# Patient Record
Sex: Female | Born: 1985 | Race: White | Hispanic: No | Marital: Married | Smoking: Never smoker
Health system: Southern US, Community
[De-identification: ages and names within clinical notes are randomized; demographics above are authoritative.]

## PROBLEM LIST (undated history)

## (undated) ENCOUNTER — Inpatient Hospital Stay (HOSPITAL_COMMUNITY): Payer: Commercial Managed Care - HMO

## (undated) DIAGNOSIS — Z3490 Encounter for supervision of normal pregnancy, unspecified, unspecified trimester: Secondary | ICD-10-CM

## (undated) DIAGNOSIS — I899 Noninfective disorder of lymphatic vessels and lymph nodes, unspecified: Secondary | ICD-10-CM

## (undated) DIAGNOSIS — Z3491 Encounter for supervision of normal pregnancy, unspecified, first trimester: Secondary | ICD-10-CM

## (undated) DIAGNOSIS — O1495 Unspecified pre-eclampsia, complicating the puerperium: Secondary | ICD-10-CM

## (undated) DIAGNOSIS — Z309 Encounter for contraceptive management, unspecified: Secondary | ICD-10-CM

## (undated) HISTORY — DX: Encounter for supervision of normal pregnancy, unspecified, first trimester: Z34.91

## (undated) HISTORY — PX: NO PAST SURGERIES: SHX2092

## (undated) HISTORY — DX: Encounter for contraceptive management, unspecified: Z30.9

## (undated) HISTORY — DX: Encounter for supervision of normal pregnancy, unspecified, unspecified trimester: Z34.90

## (undated) HISTORY — DX: Noninfective disorder of lymphatic vessels and lymph nodes, unspecified: I89.9

## (undated) HISTORY — DX: Unspecified pre-eclampsia, complicating the puerperium: O14.95

---

## 2008-08-05 ENCOUNTER — Other Ambulatory Visit: Admission: RE | Admit: 2008-08-05 | Discharge: 2008-08-05 | Payer: Self-pay | Admitting: Obstetrics and Gynecology

## 2009-08-08 ENCOUNTER — Other Ambulatory Visit: Admission: RE | Admit: 2009-08-08 | Discharge: 2009-08-08 | Payer: Self-pay | Admitting: Obstetrics and Gynecology

## 2010-11-08 ENCOUNTER — Ambulatory Visit (HOSPITAL_COMMUNITY)
Admission: RE | Admit: 2010-11-08 | Discharge: 2010-11-08 | Payer: Self-pay | Source: Home / Self Care | Attending: Urology | Admitting: Urology

## 2010-11-13 ENCOUNTER — Other Ambulatory Visit
Admission: RE | Admit: 2010-11-13 | Discharge: 2010-11-13 | Payer: Self-pay | Source: Home / Self Care | Admitting: Obstetrics and Gynecology

## 2012-03-12 ENCOUNTER — Other Ambulatory Visit: Payer: Self-pay | Admitting: Adult Health

## 2012-03-12 DIAGNOSIS — N63 Unspecified lump in unspecified breast: Secondary | ICD-10-CM

## 2012-03-19 ENCOUNTER — Ambulatory Visit (HOSPITAL_COMMUNITY)
Admission: RE | Admit: 2012-03-19 | Discharge: 2012-03-19 | Disposition: A | Discharge status: Home / Self Care | Payer: Managed Care, Other (non HMO) | Source: Ambulatory Visit | Attending: Adult Health | Admitting: Adult Health

## 2012-03-19 DIAGNOSIS — Z803 Family history of malignant neoplasm of breast: Secondary | ICD-10-CM | POA: Insufficient documentation

## 2012-03-19 DIAGNOSIS — N63 Unspecified lump in unspecified breast: Secondary | ICD-10-CM | POA: Insufficient documentation

## 2013-05-04 ENCOUNTER — Encounter: Payer: Self-pay | Admitting: *Deleted

## 2013-05-04 ENCOUNTER — Encounter: Payer: Self-pay | Admitting: Adult Health

## 2013-05-04 ENCOUNTER — Other Ambulatory Visit (HOSPITAL_COMMUNITY)
Admission: RE | Admit: 2013-05-04 | Discharge: 2013-05-04 | Disposition: A | Discharge status: Home / Self Care | Payer: Managed Care, Other (non HMO) | Source: Ambulatory Visit | Attending: Obstetrics and Gynecology | Admitting: Obstetrics and Gynecology

## 2013-05-04 ENCOUNTER — Ambulatory Visit (INDEPENDENT_AMBULATORY_CARE_PROVIDER_SITE_OTHER): Payer: Managed Care, Other (non HMO) | Admitting: Adult Health

## 2013-05-04 VITALS — BP 112/58 | Ht 63.0 in | Wt 108.0 lb

## 2013-05-04 DIAGNOSIS — Z01419 Encounter for gynecological examination (general) (routine) without abnormal findings: Secondary | ICD-10-CM

## 2013-05-04 NOTE — Patient Instructions (Addendum)
Physical in 1 year Call prn problems 

## 2013-05-04 NOTE — Progress Notes (Signed)
Patient ID: KEIRAN SIAS, female   DOB: Nov 14, 1985, 27 y.o.   MRN: [id number] History of Present Illness:  Morgan Hodges is a 27 year old white female in for her pap and physical. Has been off OCs since November and periods are every 30-40 days, and is ok if she gets pregnant.  Current Medications, Allergies, Past Medical History, Past Surgical History, Family History and Social History were reviewed in [employer] record.     Review of Systems: Patient denies any headaches, blurred vision, shortness of breath, chest pain, abdominal pain, problems with bowel movements, urination, or intercourse. No joint pain or mood changes     Physical Exam:BP 112/58  Ht 5\' 3"  (1.6 m)  Wt 108 lb (48.988 kg)  BMI 19.14 kg/m2  LMP 04/21/2013 General:  Well developed, well nourished, no acute distress Skin:  Warm and dry, freckles Neck:  Midline trachea, normal thyroid Lungs; Clear to auscultation bilaterally Breast:  No dominant palpable mass, retraction, or nipple discharge Cardiovascular: Regular rate and rhythm Abdomen:  Soft, non tender, no hepatosplenomegaly Pelvic:  External genitalia is normal in appearance.  The vagina is normal in appearance.  The cervix is nulliparous, pap performed. Uterus is felt to be normal size, shape, and contour.  No  adnexal masses or tenderness noted Extremities:  No swelling or varicosities noted Psych: alert and cooperative, seems happy   Impression: Yearly gyn exam    Plan: Physical in 1 year Continue OTC prenatal vitamin Call prn problems

## 2013-07-20 ENCOUNTER — Ambulatory Visit (INDEPENDENT_AMBULATORY_CARE_PROVIDER_SITE_OTHER): Payer: Managed Care, Other (non HMO) | Admitting: Adult Health

## 2013-07-20 ENCOUNTER — Encounter: Payer: Self-pay | Admitting: Adult Health

## 2013-07-20 VITALS — BP 112/60 | Ht 63.0 in | Wt 110.0 lb

## 2013-07-20 DIAGNOSIS — R300 Dysuria: Secondary | ICD-10-CM

## 2013-07-20 DIAGNOSIS — Z3202 Encounter for pregnancy test, result negative: Secondary | ICD-10-CM

## 2013-07-20 LAB — POCT URINALYSIS DIPSTICK
Blood, UA: NEGATIVE
Ketones, UA: NEGATIVE
Protein, UA: NEGATIVE

## 2013-07-22 LAB — URINE CULTURE

## 2013-07-23 ENCOUNTER — Telehealth: Payer: Self-pay | Admitting: Adult Health

## 2013-07-23 NOTE — Telephone Encounter (Signed)
Left message to call.

## 2013-07-27 ENCOUNTER — Telehealth: Payer: Self-pay | Admitting: Adult Health

## 2013-07-28 ENCOUNTER — Other Ambulatory Visit: Payer: Self-pay | Admitting: Obstetrics & Gynecology

## 2013-07-28 DIAGNOSIS — O3680X0 Pregnancy with inconclusive fetal viability, not applicable or unspecified: Secondary | ICD-10-CM

## 2013-07-30 ENCOUNTER — Other Ambulatory Visit: Payer: Managed Care, Other (non HMO)

## 2013-07-31 ENCOUNTER — Ambulatory Visit (INDEPENDENT_AMBULATORY_CARE_PROVIDER_SITE_OTHER): Payer: Managed Care, Other (non HMO)

## 2013-07-31 DIAGNOSIS — O3680X0 Pregnancy with inconclusive fetal viability, not applicable or unspecified: Secondary | ICD-10-CM

## 2013-07-31 DIAGNOSIS — O26849 Uterine size-date discrepancy, unspecified trimester: Secondary | ICD-10-CM

## 2013-07-31 NOTE — Progress Notes (Signed)
U/S-single IUP with +FCA noted, CRL c/w 6+6wks, EDD 03/20/2014, cx long and closed, FHR=153bpm, C.L. Noted on RT=3.6cm simple, Lt ovary appears wnl, no free fluid noted within pelvis

## 2013-08-10 ENCOUNTER — Encounter: Payer: Self-pay | Admitting: Women's Health

## 2013-08-10 ENCOUNTER — Ambulatory Visit (INDEPENDENT_AMBULATORY_CARE_PROVIDER_SITE_OTHER): Payer: Managed Care, Other (non HMO) | Admitting: Women's Health

## 2013-08-10 VITALS — BP 110/60 | Wt 110.5 lb

## 2013-08-10 DIAGNOSIS — Z3401 Encounter for supervision of normal first pregnancy, first trimester: Secondary | ICD-10-CM

## 2013-08-10 DIAGNOSIS — Z331 Pregnant state, incidental: Secondary | ICD-10-CM

## 2013-08-10 DIAGNOSIS — Z1389 Encounter for screening for other disorder: Secondary | ICD-10-CM

## 2013-08-10 DIAGNOSIS — Z3400 Encounter for supervision of normal first pregnancy, unspecified trimester: Secondary | ICD-10-CM

## 2013-08-10 LAB — POCT URINALYSIS DIPSTICK
Glucose, UA: NEGATIVE
Ketones, UA: NEGATIVE
Leukocytes, UA: NEGATIVE
Protein, UA: NEGATIVE

## 2013-08-10 LAB — URINALYSIS
Hgb urine dipstick: NEGATIVE
Leukocytes, UA: NEGATIVE
Nitrite: NEGATIVE
Protein, ur: NEGATIVE mg/dL
pH: 6.5 (ref 5.0–8.0)

## 2013-08-10 LAB — CBC
Platelets: 303 10*3/uL (ref 150–400)
RBC: 4.1 MIL/uL (ref 3.87–5.11)
WBC: 10.9 10*3/uL — ABNORMAL HIGH (ref 4.0–10.5)

## 2013-08-10 LAB — HEPATITIS B SURFACE ANTIGEN: Hepatitis B Surface Ag: NEGATIVE

## 2013-08-10 NOTE — Telephone Encounter (Signed)
No answer x1

## 2013-08-10 NOTE — Progress Notes (Signed)
New OB packet given. Consents signed.  

## 2013-08-10 NOTE — Patient Instructions (Addendum)
Nausea & Vomiting  Have saltine crackers or pretzels by your bed and eat a few bites before you raise your head out of bed in the morning  Eat small frequent meals throughout the day instead of large meals  Drink plenty of fluids throughout the day to stay hydrated, just don't drink a lot of fluids with your meals.  This can make your stomach fill up faster making you feel sick  Do not brush your teeth right after you eat  Products with real ginger are good for nausea, like ginger ale and ginger hard candy Make sure it says made with real ginger!  Sucking on sour candy like lemon heads is also good for nausea  If your prenatal vitamins make you nauseated, take them at night so you will sleep through the nausea  If you feel like you need medicine for the nausea & vomiting please let us know  If you are unable to keep any fluids or food down please let us know    Pregnancy - First Trimester During sexual intercourse, millions of sperm go into the vagina. Only 1 sperm will penetrate and fertilize the female egg while it is in the Fallopian tube. One week later, the fertilized egg implants into the wall of the uterus. An embryo begins to develop into a baby. At 6 to 8 weeks, the eyes and face are formed and the heartbeat can be seen on ultrasound. At the end of 12 weeks (first trimester), all the baby's organs are formed. Now that you are pregnant, you will want to do everything you can to have a healthy baby. Two of the most important things are to get good prenatal care and follow your caregiver's instructions. Prenatal care is all the medical care you receive before the baby's birth. It is given to prevent, find, and treat problems during the pregnancy and childbirth. PRENATAL EXAMS  During prenatal visits, your weight, blood pressure, and urine are checked. This is done to make sure you are healthy and progressing normally during the pregnancy.  A pregnant woman should gain 25 to 35 pounds  during the pregnancy. However, if you are overweight or underweight, your caregiver will advise you regarding your weight.  Your caregiver will ask and answer questions for you.  Blood work, cervical cultures, other necessary tests, and a Pap test are done during your prenatal exams. These tests are done to check on your health and the probable health of your baby. Tests are strongly recommended and done for HIV with your permission. This is the virus that causes AIDS. These tests are done because medicines can be given to help prevent your baby from being born with this infection should you have been infected without knowing it. Blood work is also used to find out your blood type, previous infections, and follow your blood levels (hemoglobin).  Low hemoglobin (anemia) is common during pregnancy. Iron and vitamins are given to help prevent this. Later in the pregnancy, blood tests for diabetes will be done along with any other tests if any problems develop.  You may need other tests to make sure you and the baby are doing well. CHANGES DURING THE FIRST TRIMESTER  Your body goes through many changes during pregnancy. They vary from person to person. Talk to your caregiver about changes you notice and are concerned about. Changes can include:  Your menstrual period stops.  The egg and sperm carry the genes that determine what you look like. Genes from you   and your partner are forming a baby. The female genes determine whether the baby is a boy or a girl.  Your body increases in girth and you may feel bloated.  Feeling sick to your stomach (nauseous) and throwing up (vomiting). If the vomiting is uncontrollable, call your caregiver.  Your breasts will begin to enlarge and become tender.  Your nipples may stick out more and become darker.  The need to urinate more. Painful urination may mean you have a bladder infection.  Tiring easily.  Loss of appetite.  Cravings for certain kinds of  food.  At first, you may gain or lose a couple of pounds.  You may have changes in your emotions from day to day (excited to be pregnant or concerned something may go wrong with the pregnancy and baby).  You may have more vivid and strange dreams. HOME CARE INSTRUCTIONS   It is very important to avoid all smoking, alcohol and non-prescribed drugs during your pregnancy. These affect the formation and growth of the baby. Avoid chemicals while pregnant to ensure the delivery of a healthy infant.  Start your prenatal visits by the 12th week of pregnancy. They are usually scheduled monthly at first, then more often in the last 2 months before delivery. Keep your caregiver's appointments. Follow your caregiver's instructions regarding medicine use, blood and lab tests, exercise, and diet.  During pregnancy, you are providing food for you and your baby. Eat regular, well-balanced meals. Choose foods such as meat, fish, milk and other low fat dairy products, vegetables, fruits, and whole-grain breads and cereals. Your caregiver will tell you of the ideal weight gain.  You can help morning sickness by keeping soda crackers at the bedside. Eat a couple before arising in the morning. You may want to use the crackers without salt on them.  Eating 4 to 5 small meals rather than 3 large meals a day also may help the nausea and vomiting.  Drinking liquids between meals instead of during meals also seems to help nausea and vomiting.  A physical sexual relationship may be continued throughout pregnancy if there are no other problems. Problems may be early (premature) leaking of amniotic fluid from the membranes, vaginal bleeding, or belly (abdominal) pain.  Exercise regularly if there are no restrictions. Check with your caregiver or physical therapist if you are unsure of the safety of some of your exercises. Greater weight gain will occur in the last 2 trimesters of pregnancy. Exercising will  help:  Control your weight.  Keep you in shape.  Prepare you for labor and delivery.  Help you lose your pregnancy weight after you deliver your baby.  Wear a good support or jogging bra for breast tenderness during pregnancy. This may help if worn during sleep too.  Ask when prenatal classes are available. Begin classes when they are offered.  Do not use hot tubs, steam rooms, or saunas.  Wear your seat belt when driving. This protects you and your baby if you are in an accident.  Avoid raw meat, uncooked cheese, cat litter boxes, and soil used by cats throughout the pregnancy. These carry germs that can cause birth defects in the baby.  The first trimester is a good time to visit your dentist for your dental health. Getting your teeth cleaned is okay. Use a softer toothbrush and brush gently during pregnancy.  Ask for help if you have financial, counseling, or nutritional needs during pregnancy. Your caregiver will be able to offer counseling for   these needs as well as refer you for other special needs.  Do not take any medicines or herbs unless told by your caregiver.  Inform your caregiver if there is any mental or physical domestic violence.  Make a list of emergency phone numbers of family, friends, hospital, and police and fire departments.  Write down your questions. Take them to your prenatal visit.  Do not douche.  Do not cross your legs.  If you have to stand for long periods of time, rotate you feet or take small steps in a circle.  You may have more vaginal secretions that may require a sanitary pad. Do not use tampons or scented sanitary pads. MEDICINES AND DRUG USE IN PREGNANCY  Take prenatal vitamins as directed. The vitamin should contain 1 milligram of folic acid. Keep all vitamins out of reach of children. Only a couple vitamins or tablets containing iron may be fatal to a baby or young child when ingested.  Avoid use of all medicines, including herbs,  over-the-counter medicines, not prescribed or suggested by your caregiver. Only take over-the-counter or prescription medicines for pain, discomfort, or fever as directed by your caregiver. Do not use aspirin, ibuprofen, or naproxen unless directed by your caregiver.  Let your caregiver also know about herbs you may be using.  Alcohol is related to a number of birth defects. This includes fetal alcohol syndrome. All alcohol, in any form, should be avoided completely. Smoking will cause low birth rate and premature babies.  Street or illegal drugs are very harmful to the baby. They are absolutely forbidden. A baby born to an addicted mother will be addicted at birth. The baby will go through the same withdrawal an adult does.  Let your caregiver know about any medicines that you have to take and for what reason you take them. SEEK MEDICAL CARE IF:  You have any concerns or worries during your pregnancy. It is better to call with your questions if you feel they cannot wait, rather than worry about them. SEEK IMMEDIATE MEDICAL CARE IF:   An unexplained oral temperature above 102 F (38.9 C) develops, or as your caregiver suggests.  You have leaking of fluid from the vagina (birth canal). If leaking membranes are suspected, take your temperature and inform your caregiver of this when you call.  There is vaginal spotting or bleeding. Notify your caregiver of the amount and how many pads are used.  You develop a bad smelling vaginal discharge with a change in the color.  You continue to feel sick to your stomach (nauseated) and have no relief from remedies suggested. You vomit blood or coffee ground-like materials.  You lose more than 2 pounds of weight in 1 week.  You gain more than 2 pounds of weight in 1 week and you notice swelling of your face, hands, feet, or legs.  You gain 5 pounds or more in 1 week (even if you do not have swelling of your hands, face, legs, or feet).  You get  exposed to German measles and have never had them.  You are exposed to fifth disease or chickenpox.  You develop belly (abdominal) pain. Round ligament discomfort is a common non-cancerous (benign) cause of abdominal pain in pregnancy. Your caregiver still must evaluate this.  You develop headache, fever, diarrhea, pain with urination, or shortness of breath.  You fall or are in a car accident or have any kind of trauma.  There is mental or physical violence in your home. Document   Released: 10/16/2001 Document Revised: 07/16/2012 Document Reviewed: 04/19/2009 Exit[employer] Patient Information 2014 [employer].  

## 2013-08-10 NOTE — Progress Notes (Addendum)
  Subjective:    Morgan Hodges is a 27 y.o. G1P0 Caucasian female at [redacted]w[redacted]d by 6.6wk u/s, being seen today for her first obstetrical visit.  Her obstetrical history is significant for primigravida.  Pregnancy history fully reviewed.   Patient reports rare nausea. No cramping, vb, uti s/s, abnormal/malodorous d/c or vulvovaginal itching/irritation. . She does report more frequent ha's than before getting pregnant. Are usually at base of skull, does help when she eats.   Filed Vitals:   08/10/13 1054  BP: 110/60  Weight: 110 lb 8 oz (50.122 kg)    HISTORY: OB History  Gravida Para Term Preterm AB SAB TAB Ectopic Multiple Living  1             # Outcome Date GA Lbr Len/2nd Weight Sex Delivery Anes PTL Lv  1 CUR              Past Medical History  Diagnosis Date  . Lymph node disorder     cervical   History reviewed. No pertinent past surgical history. Family History  Problem Relation Age of Onset  . Cancer Mother     breast  . Cancer Maternal Grandmother     breast     Exam   System:     Skin: normal coloration and turgor, no rashes    Neurologic: oriented, normal mood   Extremities: normal strength, tone, and muscle mass   HEENT PERRLA   Mouth/Teeth mucous membranes moist   Cardiovascular: regular rate and rhythm   Respiratory:  appears well, vitals normal, no respiratory distress, acyanotic, normal RR   Abdomen: soft, non-tender   FHR: 181 via informal u/s by tasha Thin prep pap smear not obtained, had neg pap 04/2013   Assessment:    Pregnancy: G1P0 Patient Active Problem List   Diagnosis Date Noted  . Supervision of normal first pregnancy 08/10/2013    Priority: High      [redacted]w[redacted]d G1P0 New OB visit Headaches Rare nausea    Plan:     Initial labs drawn Continue prenatal vitamins Problem list reviewed and updated Reviewed n/v relief measures and warning s/s to report Reviewed recommended weight gain based on pre-gravid BMI Encouraged  well-balanced diet Genetic Screening discussed Integrated Screen: requested Cystic fibrosis screening discussed requested Ultrasound discussed; fetal survey: requested Follow up in 4 weeks for 1st nt/it and visit Reviewed headache prevention/relief measures including shoulder/neck massages d/t possible tension component To try to avoid medicines other than pnv in 1st trimester, but if needs to take something for ha, to try apap  Marge Duncans 08/10/2013 11:09 AM

## 2013-08-11 ENCOUNTER — Encounter: Payer: Self-pay | Admitting: Women's Health

## 2013-08-11 LAB — DRUG SCREEN, URINE, NO CONFIRMATION
Barbiturate Quant, Ur: NEGATIVE
Creatinine,U: 129.8 mg/dL
Opiate Screen, Urine: NEGATIVE
Propoxyphene: NEGATIVE

## 2013-08-11 LAB — GC/CHLAMYDIA PROBE AMP: CT Probe RNA: NEGATIVE

## 2013-08-11 LAB — RUBELLA SCREEN: Rubella: 2.52 Index — ABNORMAL HIGH (ref <0.90)

## 2013-08-11 LAB — ANTIBODY SCREEN: Antibody Screen: NEGATIVE

## 2013-08-13 LAB — CYSTIC FIBROSIS DIAGNOSTIC STUDY

## 2013-08-15 ENCOUNTER — Encounter: Payer: Self-pay | Admitting: Women's Health

## 2013-09-11 ENCOUNTER — Other Ambulatory Visit: Payer: Self-pay | Admitting: Obstetrics & Gynecology

## 2013-09-11 ENCOUNTER — Other Ambulatory Visit: Payer: Self-pay | Admitting: Women's Health

## 2013-09-11 ENCOUNTER — Ambulatory Visit (INDEPENDENT_AMBULATORY_CARE_PROVIDER_SITE_OTHER): Payer: Managed Care, Other (non HMO) | Admitting: Obstetrics & Gynecology

## 2013-09-11 ENCOUNTER — Encounter (INDEPENDENT_AMBULATORY_CARE_PROVIDER_SITE_OTHER): Payer: Self-pay

## 2013-09-11 ENCOUNTER — Ambulatory Visit (INDEPENDENT_AMBULATORY_CARE_PROVIDER_SITE_OTHER): Payer: Managed Care, Other (non HMO)

## 2013-09-11 ENCOUNTER — Encounter: Payer: Self-pay | Admitting: Obstetrics & Gynecology

## 2013-09-11 VITALS — BP 100/60 | Wt 113.0 lb

## 2013-09-11 DIAGNOSIS — Z331 Pregnant state, incidental: Secondary | ICD-10-CM

## 2013-09-11 DIAGNOSIS — Z1389 Encounter for screening for other disorder: Secondary | ICD-10-CM

## 2013-09-11 DIAGNOSIS — Z3403 Encounter for supervision of normal first pregnancy, third trimester: Secondary | ICD-10-CM

## 2013-09-11 DIAGNOSIS — Z3400 Encounter for supervision of normal first pregnancy, unspecified trimester: Secondary | ICD-10-CM

## 2013-09-11 DIAGNOSIS — Z36 Encounter for antenatal screening of mother: Secondary | ICD-10-CM

## 2013-09-11 DIAGNOSIS — Z3401 Encounter for supervision of normal first pregnancy, first trimester: Secondary | ICD-10-CM

## 2013-09-11 LAB — POCT URINALYSIS DIPSTICK
Blood, UA: NEGATIVE
Glucose, UA: NEGATIVE
Protein, UA: NEGATIVE

## 2013-09-11 NOTE — Progress Notes (Signed)
No bleeding no problems nausea improving no emesis

## 2013-09-11 NOTE — Progress Notes (Signed)
U/S(12+6wks)-single IUP with +FCA noted, FHR- 165 bpm, cx long and closed, bilateral adnexa wnl, CRL c/w dates, NB present, NT-1.52mm, posterior Gr 0 placenta

## 2013-10-08 ENCOUNTER — Encounter: Payer: Self-pay | Admitting: Advanced Practice Midwife

## 2013-10-08 ENCOUNTER — Ambulatory Visit (INDEPENDENT_AMBULATORY_CARE_PROVIDER_SITE_OTHER): Payer: Managed Care, Other (non HMO) | Admitting: Advanced Practice Midwife

## 2013-10-08 ENCOUNTER — Other Ambulatory Visit: Payer: Self-pay | Admitting: Advanced Practice Midwife

## 2013-10-08 VITALS — BP 102/48 | Wt 116.2 lb

## 2013-10-08 DIAGNOSIS — Z331 Pregnant state, incidental: Secondary | ICD-10-CM

## 2013-10-08 DIAGNOSIS — Z3402 Encounter for supervision of normal first pregnancy, second trimester: Secondary | ICD-10-CM

## 2013-10-08 DIAGNOSIS — Z1389 Encounter for screening for other disorder: Secondary | ICD-10-CM

## 2013-10-08 DIAGNOSIS — O9989 Other specified diseases and conditions complicating pregnancy, childbirth and the puerperium: Secondary | ICD-10-CM

## 2013-10-08 LAB — POCT URINALYSIS DIPSTICK
Glucose, UA: NEGATIVE
Ketones, UA: NEGATIVE
Leukocytes, UA: NEGATIVE

## 2013-10-08 MED ORDER — BUTALBITAL-APAP-CAFFEINE 50-325-40 MG PO TABS: 1.0000 | ORAL_TABLET | Freq: Four times a day (QID) | ORAL | Status: DC | PRN

## 2013-10-08 NOTE — Progress Notes (Signed)
Having headaches unrelieved by tylenol, rest and neck massages. Rx for Fioricet given. Denies pain, bleeding and leaking of fluid. All questions answered. F/u in 3 weeks for visit and ultrasound.

## 2013-10-11 LAB — MATERNAL SCREEN, INTEGRATED #2
AFP, Serum: 60 ng/mL
Age risk Down Syndrome: 1:860 {titer}
Estriol Mom: 1.12
Estriol, Free: 1.39 ng/mL
Inhibin A Dimeric: 113 pg/mL
Inhibin A MoM: 0.6
MSS Down Syndrome: <1:5000 {titer}
Number of fetuses: 1
PAPP-A: 1057 ng/mL
Rish for ONTD: <1:5000 {titer}

## 2013-10-27 ENCOUNTER — Other Ambulatory Visit: Payer: Managed Care, Other (non HMO)

## 2013-10-27 ENCOUNTER — Encounter: Payer: Managed Care, Other (non HMO) | Admitting: Women's Health

## 2013-11-05 NOTE — L&D Delivery Note (Signed)
I directly supervised birth and laceration repair. Cord clamped and cut and nonvigorous infant taken to warmer for waiting NICU team d/t mod-thick mec. Please refer to their notes.  Plans to breastfeed, micronor for contraception Cheral MarkerKimberly R. Lundyn Coste, CNM, [facility]WHNP-BC 03/21/2014 8:34 AM

## 2013-11-05 NOTE — L&D Delivery Note (Signed)
Delivery Note At 5:26 AM a viable female was delivered via Vaginal, Spontaneous Delivery (Presentation: ; Occiput Anterior).  APGAR: 7, 9; weight 7 lb 6 oz (3345 g).   Placenta status: Intact, Spontaneous.  Cord: 3 vessels with the following complications: marginal insertion  Anesthesia: Local Epidural, local lidocaine 9 cc injected at site of lacerations Episiotomy: none Lacerations: left labial and right sulcus Suture Repair: 3.0 vicryl Est. Blood Loss (mL): 400  Mom to postpartum.  Baby to Couplet care / Skin to Skin.  Morgan Hodges 03/21/2014, 6:19 AM

## 2013-11-09 ENCOUNTER — Encounter (INDEPENDENT_AMBULATORY_CARE_PROVIDER_SITE_OTHER): Payer: Self-pay

## 2013-11-09 ENCOUNTER — Encounter: Payer: Self-pay | Admitting: Women's Health

## 2013-11-09 ENCOUNTER — Other Ambulatory Visit: Payer: Self-pay | Admitting: Advanced Practice Midwife

## 2013-11-09 ENCOUNTER — Ambulatory Visit (INDEPENDENT_AMBULATORY_CARE_PROVIDER_SITE_OTHER): Payer: 59

## 2013-11-09 ENCOUNTER — Ambulatory Visit (INDEPENDENT_AMBULATORY_CARE_PROVIDER_SITE_OTHER): Payer: 59 | Admitting: Women's Health

## 2013-11-09 VITALS — BP 110/52 | Wt 121.5 lb

## 2013-11-09 DIAGNOSIS — Z1389 Encounter for screening for other disorder: Secondary | ICD-10-CM

## 2013-11-09 DIAGNOSIS — Z3400 Encounter for supervision of normal first pregnancy, unspecified trimester: Secondary | ICD-10-CM

## 2013-11-09 DIAGNOSIS — Z3402 Encounter for supervision of normal first pregnancy, second trimester: Secondary | ICD-10-CM

## 2013-11-09 DIAGNOSIS — Z331 Pregnant state, incidental: Secondary | ICD-10-CM

## 2013-11-09 LAB — POCT URINALYSIS DIPSTICK
GLUCOSE UA: NEGATIVE
Ketones, UA: NEGATIVE
LEUKOCYTES UA: NEGATIVE
NITRITE UA: NEGATIVE
Protein, UA: NEGATIVE
RBC UA: NEGATIVE

## 2013-11-09 NOTE — Progress Notes (Signed)
U/S(21+2wks)-active fetus, breech presentation, meas c/w dates, fluid wnl, posterior Gr 0 placenta, cx long and closed (3.7cm), bilateral adnexa WNL, no major abnl noted, female fetus, FHR-161 bpm

## 2013-11-09 NOTE — Patient Instructions (Signed)
[place] Pediatricians:  [facility] [phone]            [facility] [phone]                 [facility] [phone] (usually doesn't accept new patients unless you have family there already, you are always welcome to call and ask)             [facility] ([address]) [phone]   [facility] Pediatricians:   [facility]: [phone]  [facility]: [phone]   Second Trimester of Pregnancy The second trimester is from week 13 through week 28, months 4 through 6. The second trimester is often a time when you feel your best. Your body has also adjusted to being pregnant, and you begin to feel better physically. Usually, morning sickness has lessened or quit completely, you may have more energy, and you may have an increase in appetite. The second trimester is also a time when the fetus is growing rapidly. At the end of the sixth month, the fetus is about 9 inches long and weighs about 1 pounds. You will likely begin to feel the baby move (quickening) between 18 and 20 weeks of the pregnancy. BODY CHANGES Your body goes through many changes during pregnancy. The changes vary from woman to woman.   Your weight will continue to increase. You will notice your lower abdomen bulging out.  You may begin to get stretch marks on your hips, abdomen, and breasts.  You may develop headaches that can be relieved by medicines approved by your caregiver.  You may urinate more often because the fetus is pressing on your bladder.  You may develop or continue to have heartburn as a result of your pregnancy.  You may develop constipation because certain hormones are causing the muscles that push waste through your intestines to slow down.  You may develop hemorrhoids or swollen, bulging veins (varicose veins).  You may have back pain because of the weight gain and pregnancy  hormones relaxing your joints between the bones in your pelvis and as a result of a shift in weight and the muscles that support your balance.  Your breasts will continue to grow and be tender.  Your gums may bleed and may be sensitive to brushing and flossing.  Dark spots or blotches (chloasma, mask of pregnancy) may develop on your face. This will likely fade after the baby is born.  A dark line from your belly button to the pubic area (linea nigra) may appear. This will likely fade after the baby is born. WHAT TO EXPECT AT YOUR PRENATAL VISITS During a routine prenatal visit:  You will be weighed to make sure you and the fetus are growing normally.  Your blood pressure will be taken.  Your abdomen will be measured to track your baby's growth.  The fetal heartbeat will be listened to.  Any test results from the previous visit will be discussed. Your caregiver may ask you:  How you are feeling.  If you are feeling the baby move.  If you have had any abnormal symptoms, such as leaking fluid, bleeding, severe headaches, or abdominal cramping.  If you have any questions. Other tests that may be performed during your second trimester include:  Blood tests that check for:  Low iron levels (anemia).  Gestational diabetes (between 24 and 28 weeks).  Rh antibodies.  Urine tests to check for infections, diabetes, or protein  in the urine.  An ultrasound to confirm the proper growth and development of the baby.  An amniocentesis to check for possible genetic problems.  Fetal screens for spina bifida and Down syndrome. HOME CARE INSTRUCTIONS   Avoid all smoking, herbs, alcohol, and unprescribed drugs. These chemicals affect the formation and growth of the baby.  Follow your caregiver's instructions regarding medicine use. There are medicines that are either safe or unsafe to take during pregnancy.  Exercise only as directed by your caregiver. Experiencing uterine cramps is a  good sign to stop exercising.  Continue to eat regular, healthy meals.  Wear a good support bra for breast tenderness.  Do not use hot tubs, steam rooms, or saunas.  Wear your seat belt at all times when driving.  Avoid raw meat, uncooked cheese, cat litter boxes, and soil used by cats. These carry germs that can cause birth defects in the baby.  Take your prenatal vitamins.  Try taking a stool softener (if your caregiver approves) if you develop constipation. Eat more high-fiber foods, such as fresh vegetables or fruit and whole grains. Drink plenty of fluids to keep your urine clear or pale yellow.  Take warm sitz baths to soothe any pain or discomfort caused by hemorrhoids. Use hemorrhoid cream if your caregiver approves.  If you develop varicose veins, wear support hose. Elevate your feet for 15 minutes, 3 4 times a day. Limit salt in your diet.  Avoid heavy lifting, wear low heel shoes, and practice good posture.  Rest with your legs elevated if you have leg cramps or low back pain.  Visit your dentist if you have not gone yet during your pregnancy. Use a soft toothbrush to brush your teeth and be gentle when you floss.  A sexual relationship may be continued unless your caregiver directs you otherwise.  Continue to go to all your prenatal visits as directed by your caregiver. SEEK MEDICAL CARE IF:   You have dizziness.  You have mild pelvic cramps, pelvic pressure, or nagging pain in the abdominal area.  You have persistent nausea, vomiting, or diarrhea.  You have a bad smelling vaginal discharge.  You have pain with urination. SEEK IMMEDIATE MEDICAL CARE IF:   You have a fever.  You are leaking fluid from your vagina.  You have spotting or bleeding from your vagina.  You have severe abdominal cramping or pain.  You have rapid weight gain or loss.  You have shortness of breath with chest pain.  You notice sudden or extreme swelling of your face, hands,  ankles, feet, or legs.  You have not felt your baby move in over an hour.  You have severe headaches that do not go away with medicine.  You have vision changes. Document Released: 10/16/2001 Document Revised: 06/24/2013 Document Reviewed: 12/23/2012 [facility] Patient Information 2014 [place], [place].

## 2013-11-09 NOTE — Progress Notes (Signed)
Reports good fm. Denies uc's, lof, vb, urinary frequency, urgency, hesitancy, or dysuria.  RLP, discussed prevention/relief measures.  Reviewed today's u/s, ptl warning s/s.  All questions answered. F/U in 3wks for visit.

## 2013-11-20 DIAGNOSIS — Z029 Encounter for administrative examinations, unspecified: Secondary | ICD-10-CM

## 2013-11-30 ENCOUNTER — Ambulatory Visit (INDEPENDENT_AMBULATORY_CARE_PROVIDER_SITE_OTHER): Payer: 59 | Admitting: Women's Health

## 2013-11-30 ENCOUNTER — Encounter: Payer: Self-pay | Admitting: Women's Health

## 2013-11-30 ENCOUNTER — Encounter (INDEPENDENT_AMBULATORY_CARE_PROVIDER_SITE_OTHER): Payer: Self-pay

## 2013-11-30 VITALS — BP 108/60 | Wt 126.0 lb

## 2013-11-30 DIAGNOSIS — Z1389 Encounter for screening for other disorder: Secondary | ICD-10-CM

## 2013-11-30 DIAGNOSIS — Z3400 Encounter for supervision of normal first pregnancy, unspecified trimester: Secondary | ICD-10-CM

## 2013-11-30 DIAGNOSIS — Z331 Pregnant state, incidental: Secondary | ICD-10-CM

## 2013-11-30 LAB — POCT URINALYSIS DIPSTICK
Blood, UA: NEGATIVE
Glucose, UA: NEGATIVE
Ketones, UA: NEGATIVE
Leukocytes, UA: NEGATIVE
NITRITE UA: NEGATIVE
PROTEIN UA: NEGATIVE

## 2013-11-30 NOTE — Progress Notes (Signed)
Reports good fm. Denies uc's, lof, vb, uti s/s.  No complaints.  Reviewed ptl s/s, fm.  All questions answered. F/U in 4wks for pn2 and visit.  

## 2013-11-30 NOTE — Patient Instructions (Signed)
You will have your sugar test next visit.  Please do not eat or drink anything after midnight the night before you come, not even water.  You will be here for at least two hours.     Second Trimester of Pregnancy The second trimester is from week 13 through week 28, months 4 through 6. The second trimester is often a time when you feel your best. Your body has also adjusted to being pregnant, and you begin to feel better physically. Usually, morning sickness has lessened or quit completely, you may have more energy, and you may have an increase in appetite. The second trimester is also a time when the fetus is growing rapidly. At the end of the sixth month, the fetus is about 9 inches long and weighs about 1 pounds. You will likely begin to feel the baby move (quickening) between 18 and 20 weeks of the pregnancy. BODY CHANGES Your body goes through many changes during pregnancy. The changes vary from woman to woman.   Your weight will continue to increase. You will notice your lower abdomen bulging out.  You may begin to get stretch marks on your hips, abdomen, and breasts.  You may develop headaches that can be relieved by medicines approved by your caregiver.  You may urinate more often because the fetus is pressing on your bladder.  You may develop or continue to have heartburn as a result of your pregnancy.  You may develop constipation because certain hormones are causing the muscles that push waste through your intestines to slow down.  You may develop hemorrhoids or swollen, bulging veins (varicose veins).  You may have back pain because of the weight gain and pregnancy hormones relaxing your joints between the bones in your pelvis and as a result of a shift in weight and the muscles that support your balance.  Your breasts will continue to grow and be tender.  Your gums may bleed and may be sensitive to brushing and flossing.  Dark spots or blotches (chloasma, mask of pregnancy)  may develop on your face. This will likely fade after the baby is born.  A dark line from your belly button to the pubic area (linea nigra) may appear. This will likely fade after the baby is born. WHAT TO EXPECT AT YOUR PRENATAL VISITS During a routine prenatal visit:  You will be weighed to make sure you and the fetus are growing normally.  Your blood pressure will be taken.  Your abdomen will be measured to track your baby's growth.  The fetal heartbeat will be listened to.  Any test results from the previous visit will be discussed. Your caregiver may ask you:  How you are feeling.  If you are feeling the baby move.  If you have had any abnormal symptoms, such as leaking fluid, bleeding, severe headaches, or abdominal cramping.  If you have any questions. Other tests that may be performed during your second trimester include:  Blood tests that check for:  Low iron levels (anemia).  Gestational diabetes (between 24 and 28 weeks).  Rh antibodies.  Urine tests to check for infections, diabetes, or protein in the urine.  An ultrasound to confirm the proper growth and development of the baby.  An amniocentesis to check for possible genetic problems.  Fetal screens for spina bifida and Down syndrome. HOME CARE INSTRUCTIONS   Avoid all smoking, herbs, alcohol, and unprescribed drugs. These chemicals affect the formation and growth of the baby.  Follow your caregiver's   instructions regarding medicine use. There are medicines that are either safe or unsafe to take during pregnancy.  Exercise only as directed by your caregiver. Experiencing uterine cramps is a good sign to stop exercising.  Continue to eat regular, healthy meals.  Wear a good support bra for breast tenderness.  Do not use hot tubs, steam rooms, or saunas.  Wear your seat belt at all times when driving.  Avoid raw meat, uncooked cheese, cat litter boxes, and soil used by cats. These carry germs that  can cause birth defects in the baby.  Take your prenatal vitamins.  Try taking a stool softener (if your caregiver approves) if you develop constipation. Eat more high-fiber foods, such as fresh vegetables or fruit and whole grains. Drink plenty of fluids to keep your urine clear or pale yellow.  Take warm sitz baths to soothe any pain or discomfort caused by hemorrhoids. Use hemorrhoid cream if your caregiver approves.  If you develop varicose veins, wear support hose. Elevate your feet for 15 minutes, 3 4 times a day. Limit salt in your diet.  Avoid heavy lifting, wear low heel shoes, and practice good posture.  Rest with your legs elevated if you have leg cramps or low back pain.  Visit your dentist if you have not gone yet during your pregnancy. Use a soft toothbrush to brush your teeth and be gentle when you floss.  A sexual relationship may be continued unless your caregiver directs you otherwise.  Continue to go to all your prenatal visits as directed by your caregiver. SEEK MEDICAL CARE IF:   You have dizziness.  You have mild pelvic cramps, pelvic pressure, or nagging pain in the abdominal area.  You have persistent nausea, vomiting, or diarrhea.  You have a bad smelling vaginal discharge.  You have pain with urination. SEEK IMMEDIATE MEDICAL CARE IF:   You have a fever.  You are leaking fluid from your vagina.  You have spotting or bleeding from your vagina.  You have severe abdominal cramping or pain.  You have rapid weight gain or loss.  You have shortness of breath with chest pain.  You notice sudden or extreme swelling of your face, hands, ankles, feet, or legs.  You have not felt your baby move in over an hour.  You have severe headaches that do not go away with medicine.  You have vision changes. Document Released: 10/16/2001 Document Revised: 06/24/2013 Document Reviewed: 12/23/2012 [employer] Patient Information 2014 [employer].  

## 2013-12-07 ENCOUNTER — Telehealth: Payer: Self-pay | Admitting: *Deleted

## 2013-12-07 NOTE — Telephone Encounter (Signed)
Spoke with pt. Pt was wondering what she could take for allergies. Advised Claritin, Zyrtec, or Benadryl with drowsy precautions. Advised if this didn't help or if she got worse, call office. Pt voiced understanding. JSY

## 2014-01-01 ENCOUNTER — Encounter: Payer: Self-pay | Admitting: Obstetrics and Gynecology

## 2014-01-01 ENCOUNTER — Other Ambulatory Visit: Payer: 59

## 2014-01-01 ENCOUNTER — Ambulatory Visit (INDEPENDENT_AMBULATORY_CARE_PROVIDER_SITE_OTHER): Payer: 59 | Admitting: Obstetrics and Gynecology

## 2014-01-01 VITALS — BP 100/58 | Wt 133.6 lb

## 2014-01-01 DIAGNOSIS — Z1389 Encounter for screening for other disorder: Secondary | ICD-10-CM

## 2014-01-01 DIAGNOSIS — Z3400 Encounter for supervision of normal first pregnancy, unspecified trimester: Secondary | ICD-10-CM | POA: Insufficient documentation

## 2014-01-01 DIAGNOSIS — Z331 Pregnant state, incidental: Secondary | ICD-10-CM

## 2014-01-01 LAB — POCT URINALYSIS DIPSTICK
GLUCOSE UA: NEGATIVE
Ketones, UA: NEGATIVE
LEUKOCYTES UA: NEGATIVE
NITRITE UA: NEGATIVE
Protein, UA: NEGATIVE
RBC UA: NEGATIVE

## 2014-01-01 LAB — CBC
HCT: 36.9 % (ref 36.0–46.0)
Hemoglobin: 12.5 g/dL (ref 12.0–15.0)
MCH: 31.6 pg (ref 26.0–34.0)
MCHC: 33.9 g/dL (ref 30.0–36.0)
MCV: 93.2 fL (ref 78.0–100.0)
PLATELETS: 235 10*3/uL (ref 150–400)
RBC: 3.96 MIL/uL (ref 3.87–5.11)
RDW: 14 % (ref 11.5–15.5)
WBC: 13.7 10*3/uL — AB (ref 4.0–10.5)

## 2014-01-01 NOTE — Progress Notes (Signed)
Pt denies any problems or concerns at this time.  

## 2014-01-01 NOTE — Progress Notes (Signed)
  7962w6d. G1P0. Signed up for childbirth classes. Good FM. No complaints or concerns. Here for PN2.   This chart was scribed by Bennett Scrapehristina Taylor, Medical Scribe, for Dr. Christin BachJohn Kimberlin Scheel on 01/01/14 at 10:20 AM. This chart was reviewed by Dr. Christin BachJohn Faith Patricelli and is accurate.

## 2014-01-02 LAB — ANTIBODY SCREEN: Antibody Screen: NEGATIVE

## 2014-01-02 LAB — GLUCOSE TOLERANCE, 2 HOURS W/ 1HR
GLUCOSE, FASTING: 73 mg/dL (ref 70–99)
Glucose, 1 hour: 128 mg/dL (ref 70–170)
Glucose, 2 hour: 105 mg/dL (ref 70–139)

## 2014-01-02 LAB — RPR

## 2014-01-02 LAB — HIV ANTIBODY (ROUTINE TESTING W REFLEX): HIV: NONREACTIVE

## 2014-01-04 ENCOUNTER — Encounter: Payer: Self-pay | Admitting: Adult Health

## 2014-01-04 LAB — HSV 2 ANTIBODY, IGG: HSV 2 Glycoprotein G Ab, IgG: <0.1 IV

## 2014-01-29 ENCOUNTER — Ambulatory Visit (INDEPENDENT_AMBULATORY_CARE_PROVIDER_SITE_OTHER): Payer: 59 | Admitting: Obstetrics & Gynecology

## 2014-01-29 ENCOUNTER — Encounter: Payer: Self-pay | Admitting: Obstetrics & Gynecology

## 2014-01-29 VITALS — BP 110/50 | Wt 137.0 lb

## 2014-01-29 DIAGNOSIS — Z331 Pregnant state, incidental: Secondary | ICD-10-CM

## 2014-01-29 DIAGNOSIS — Z1389 Encounter for screening for other disorder: Secondary | ICD-10-CM

## 2014-01-29 DIAGNOSIS — Z3400 Encounter for supervision of normal first pregnancy, unspecified trimester: Secondary | ICD-10-CM

## 2014-01-29 LAB — POCT URINALYSIS DIPSTICK
Glucose, UA: NEGATIVE
KETONES UA: NEGATIVE
Leukocytes, UA: NEGATIVE
Nitrite, UA: NEGATIVE
PROTEIN UA: NEGATIVE
RBC UA: NEGATIVE

## 2014-02-11 ENCOUNTER — Ambulatory Visit (INDEPENDENT_AMBULATORY_CARE_PROVIDER_SITE_OTHER): Payer: 59 | Admitting: Obstetrics & Gynecology

## 2014-02-11 ENCOUNTER — Encounter: Payer: Self-pay | Admitting: Obstetrics & Gynecology

## 2014-02-11 ENCOUNTER — Encounter: Payer: 59 | Admitting: Obstetrics and Gynecology

## 2014-02-11 VITALS — BP 100/70 | Wt 140.0 lb

## 2014-02-11 DIAGNOSIS — IMO0002 Reserved for concepts with insufficient information to code with codable children: Secondary | ICD-10-CM

## 2014-02-11 DIAGNOSIS — Z1389 Encounter for screening for other disorder: Secondary | ICD-10-CM

## 2014-02-11 DIAGNOSIS — Z3400 Encounter for supervision of normal first pregnancy, unspecified trimester: Secondary | ICD-10-CM

## 2014-02-11 DIAGNOSIS — Z331 Pregnant state, incidental: Secondary | ICD-10-CM

## 2014-02-11 LAB — POCT URINALYSIS DIPSTICK
Blood, UA: NEGATIVE
Glucose, UA: NEGATIVE
KETONES UA: NEGATIVE
Leukocytes, UA: NEGATIVE
Nitrite, UA: NEGATIVE
PROTEIN UA: NEGATIVE

## 2014-02-11 NOTE — Progress Notes (Signed)
BP weight and urine results all reviewed and noted. Patient reports good fetal movement, denies any bleeding and no rupture of membranes symptoms or regular contractions. Patient is without complaints. All questions were answered.  Abdomen looks a little rpominent with fetal parts will check sonogram for fluid volume

## 2014-02-19 ENCOUNTER — Encounter: Payer: Self-pay | Admitting: Obstetrics & Gynecology

## 2014-02-19 ENCOUNTER — Other Ambulatory Visit: Payer: Self-pay | Admitting: Obstetrics & Gynecology

## 2014-02-19 ENCOUNTER — Ambulatory Visit (INDEPENDENT_AMBULATORY_CARE_PROVIDER_SITE_OTHER): Payer: 59

## 2014-02-19 ENCOUNTER — Ambulatory Visit (INDEPENDENT_AMBULATORY_CARE_PROVIDER_SITE_OTHER): Payer: 59 | Admitting: Obstetrics & Gynecology

## 2014-02-19 VITALS — BP 108/70 | Wt 142.5 lb

## 2014-02-19 DIAGNOSIS — IMO0002 Reserved for concepts with insufficient information to code with codable children: Secondary | ICD-10-CM

## 2014-02-19 DIAGNOSIS — Z3400 Encounter for supervision of normal first pregnancy, unspecified trimester: Secondary | ICD-10-CM

## 2014-02-19 DIAGNOSIS — Z1389 Encounter for screening for other disorder: Secondary | ICD-10-CM

## 2014-02-19 DIAGNOSIS — O26849 Uterine size-date discrepancy, unspecified trimester: Secondary | ICD-10-CM

## 2014-02-19 DIAGNOSIS — Z331 Pregnant state, incidental: Secondary | ICD-10-CM

## 2014-02-19 LAB — POCT URINALYSIS DIPSTICK
Blood, UA: NEGATIVE
GLUCOSE UA: NEGATIVE
KETONES UA: NEGATIVE
LEUKOCYTES UA: NEGATIVE
NITRITE UA: NEGATIVE
Protein, UA: NEGATIVE

## 2014-02-19 NOTE — Progress Notes (Signed)
U/S(35+6wks)-vtx active fetus, meas c/w dates, EFW 5 lb 12 oz (33rd%tile), fluid wnl AFI- 7.4cm SDP-2.5cm, FHR- 155 BPM, fundal Gr 2 placenta

## 2014-02-19 NOTE — Progress Notes (Signed)
Sonogram reviewed and report done 33%tile and normal fluid  BP weight and urine results all reviewed and noted. Patient reports good fetal movement, denies any bleeding and no rupture of membranes symptoms or regular contractions. Patient is without complaints. All questions were answered.

## 2014-02-26 ENCOUNTER — Encounter: Payer: Self-pay | Admitting: Obstetrics & Gynecology

## 2014-02-26 ENCOUNTER — Ambulatory Visit (INDEPENDENT_AMBULATORY_CARE_PROVIDER_SITE_OTHER): Payer: 59 | Admitting: Obstetrics & Gynecology

## 2014-02-26 VITALS — BP 116/60 | Wt 143.5 lb

## 2014-02-26 DIAGNOSIS — Z1389 Encounter for screening for other disorder: Secondary | ICD-10-CM

## 2014-02-26 DIAGNOSIS — Z3400 Encounter for supervision of normal first pregnancy, unspecified trimester: Secondary | ICD-10-CM

## 2014-02-26 DIAGNOSIS — Z331 Pregnant state, incidental: Secondary | ICD-10-CM

## 2014-02-26 LAB — POCT URINALYSIS DIPSTICK
GLUCOSE UA: NEGATIVE
Ketones, UA: NEGATIVE
Leukocytes, UA: NEGATIVE
NITRITE UA: NEGATIVE
Protein, UA: NEGATIVE
RBC UA: NEGATIVE

## 2014-02-26 LAB — OB RESULTS CONSOLE GC/CHLAMYDIA
Chlamydia: NEGATIVE
Gonorrhea: NEGATIVE

## 2014-02-26 NOTE — Progress Notes (Signed)
BP weight and urine results all reviewed and noted. Patient reports good fetal movement, denies any bleeding and no rupture of membranes symptoms or regular contractions. Patient is without complaints. All questions were answered.  

## 2014-02-27 LAB — GC/CHLAMYDIA PROBE AMP
CT Probe RNA: NEGATIVE
GC PROBE AMP APTIMA: NEGATIVE

## 2014-02-28 ENCOUNTER — Encounter: Payer: Self-pay | Admitting: Obstetrics & Gynecology

## 2014-02-28 LAB — STREP B DNA PROBE: STREP GROUP B AG: NEGATIVE

## 2014-03-05 ENCOUNTER — Ambulatory Visit (INDEPENDENT_AMBULATORY_CARE_PROVIDER_SITE_OTHER): Payer: 59 | Admitting: Obstetrics & Gynecology

## 2014-03-05 VITALS — BP 120/76 | Wt 146.0 lb

## 2014-03-05 DIAGNOSIS — Z331 Pregnant state, incidental: Secondary | ICD-10-CM

## 2014-03-05 DIAGNOSIS — Z1389 Encounter for screening for other disorder: Secondary | ICD-10-CM

## 2014-03-05 DIAGNOSIS — Z3400 Encounter for supervision of normal first pregnancy, unspecified trimester: Secondary | ICD-10-CM

## 2014-03-05 LAB — POCT URINALYSIS DIPSTICK
GLUCOSE UA: NEGATIVE
Ketones, UA: NEGATIVE
Leukocytes, UA: NEGATIVE
Nitrite, UA: NEGATIVE
Protein, UA: NEGATIVE
RBC UA: NEGATIVE

## 2014-03-05 NOTE — Progress Notes (Signed)
BP weight and urine results all reviewed and noted. Patient reports good fetal movement, denies any bleeding and no rupture of membranes symptoms or regular contractions. Patient is without complaints. All questions were answered.  

## 2014-03-12 ENCOUNTER — Ambulatory Visit (INDEPENDENT_AMBULATORY_CARE_PROVIDER_SITE_OTHER): Payer: 59 | Admitting: Obstetrics & Gynecology

## 2014-03-12 ENCOUNTER — Encounter: Payer: Self-pay | Admitting: Obstetrics & Gynecology

## 2014-03-12 VITALS — BP 100/80 | Wt 146.0 lb

## 2014-03-12 DIAGNOSIS — Z331 Pregnant state, incidental: Secondary | ICD-10-CM

## 2014-03-12 DIAGNOSIS — Z3400 Encounter for supervision of normal first pregnancy, unspecified trimester: Secondary | ICD-10-CM

## 2014-03-12 DIAGNOSIS — Z1389 Encounter for screening for other disorder: Secondary | ICD-10-CM

## 2014-03-12 LAB — POCT URINALYSIS DIPSTICK
Glucose, UA: NEGATIVE
Ketones, UA: NEGATIVE
Leukocytes, UA: NEGATIVE
NITRITE UA: NEGATIVE
Protein, UA: NEGATIVE
RBC UA: NEGATIVE

## 2014-03-12 NOTE — Progress Notes (Signed)
BP weight and urine results all reviewed and noted. Patient reports good fetal movement, denies any bleeding and no rupture of membranes symptoms or regular contractions. Patient is without complaints. All questions were answered.  

## 2014-03-18 ENCOUNTER — Ambulatory Visit (INDEPENDENT_AMBULATORY_CARE_PROVIDER_SITE_OTHER): Payer: Self-pay | Admitting: Advanced Practice Midwife

## 2014-03-18 ENCOUNTER — Telehealth: Payer: Self-pay | Admitting: *Deleted

## 2014-03-18 VITALS — BP 126/70 | Wt 146.0 lb

## 2014-03-18 DIAGNOSIS — Z3400 Encounter for supervision of normal first pregnancy, unspecified trimester: Secondary | ICD-10-CM

## 2014-03-18 DIAGNOSIS — Z331 Pregnant state, incidental: Secondary | ICD-10-CM

## 2014-03-18 DIAGNOSIS — Z1389 Encounter for screening for other disorder: Secondary | ICD-10-CM

## 2014-03-18 LAB — POCT URINALYSIS DIPSTICK
Blood, UA: NEGATIVE
GLUCOSE UA: NEGATIVE
KETONES UA: NEGATIVE
Leukocytes, UA: NEGATIVE
Nitrite, UA: NEGATIVE
Protein, UA: NEGATIVE

## 2014-03-18 NOTE — Telephone Encounter (Signed)
Pt states been having contractions since about 1 am today, they are about 7 - 10 minutes apart sometimes longer.  No bleeding, no gush of fluids, good fm and contractions are not getting more intense.  Pt is scheduled to see Drenda FreezeFran here at 2:15 today.  Informed pt to rest when possible and keep appointment here this afternoon.  If contractions get 5 - 7 min apart, pain gets more intense or has a gush of fluids [facility]  Pt verbalized understanding.

## 2014-03-18 NOTE — Progress Notes (Signed)
C/O ctx last night;  Routine questions about pregnancy answered.  F/U in q weeks for Low-risk ob appt .

## 2014-03-20 ENCOUNTER — Inpatient Hospital Stay (HOSPITAL_COMMUNITY): Payer: 59 | Admitting: Anesthesiology

## 2014-03-20 ENCOUNTER — Encounter (HOSPITAL_COMMUNITY): Payer: Self-pay

## 2014-03-20 ENCOUNTER — Encounter (HOSPITAL_COMMUNITY): Payer: 59 | Admitting: Anesthesiology

## 2014-03-20 ENCOUNTER — Inpatient Hospital Stay (HOSPITAL_COMMUNITY)
Admission: AD | Admit: 2014-03-20 | Discharge: 2014-03-23 | DRG: 775 | Disposition: A | Discharge status: Home / Self Care | Payer: 59 | Source: Ambulatory Visit | Attending: Obstetrics & Gynecology | Admitting: Obstetrics & Gynecology

## 2014-03-20 DIAGNOSIS — IMO0001 Reserved for inherently not codable concepts without codable children: Secondary | ICD-10-CM

## 2014-03-20 LAB — CBC
HCT: 41.1 % (ref 36.0–46.0)
Hemoglobin: 14.1 g/dL (ref 12.0–15.0)
MCH: 32.2 pg (ref 26.0–34.0)
MCHC: 34.3 g/dL (ref 30.0–36.0)
MCV: 93.8 fL (ref 78.0–100.0)
Platelets: 184 10*3/uL (ref 150–400)
RBC: 4.38 MIL/uL (ref 3.87–5.11)
RDW: 13.7 % (ref 11.5–15.5)
WBC: 16.4 10*3/uL — ABNORMAL HIGH (ref 4.0–10.5)

## 2014-03-20 LAB — RPR

## 2014-03-20 MED ORDER — LACTATED RINGERS IV SOLN
500.0000 mL | INTRAVENOUS | Status: DC | PRN
  Administered 2014-03-20: 300 mL via INTRAVENOUS

## 2014-03-20 MED ORDER — EPHEDRINE 5 MG/ML INJ
INTRAVENOUS | Status: AC
  Filled 2014-03-20: qty 4

## 2014-03-20 MED ORDER — LIDOCAINE HCL (PF) 1 % IJ SOLN
INTRAMUSCULAR | Status: DC | PRN
  Administered 2014-03-20 (×2): 5 mL

## 2014-03-20 MED ORDER — FENTANYL 2.5 MCG/ML BUPIVACAINE 1/10 % EPIDURAL INFUSION (WH - ANES)
INTRAMUSCULAR | Status: DC | PRN
  Administered 2014-03-20: 14 mL/h via EPIDURAL

## 2014-03-20 MED ORDER — PHENYLEPHRINE 40 MCG/ML (10ML) SYRINGE FOR IV PUSH (FOR BLOOD PRESSURE SUPPORT)
PREFILLED_SYRINGE | INTRAVENOUS | Status: AC
  Filled 2014-03-20: qty 5

## 2014-03-20 MED ORDER — IBUPROFEN 600 MG PO TABS
600.0000 mg | ORAL_TABLET | Freq: Four times a day (QID) | ORAL | Status: DC | PRN
  Administered 2014-03-21: 600 mg via ORAL
  Filled 2014-03-20: qty 1

## 2014-03-20 MED ORDER — FENTANYL CITRATE 0.05 MG/ML IJ SOLN
INTRAMUSCULAR | Status: DC | PRN
  Administered 2014-03-20: 100 ug via EPIDURAL

## 2014-03-20 MED ORDER — PHENYLEPHRINE 40 MCG/ML (10ML) SYRINGE FOR IV PUSH (FOR BLOOD PRESSURE SUPPORT)
80.0000 ug | PREFILLED_SYRINGE | INTRAVENOUS | Status: DC | PRN
  Filled 2014-03-20: qty 2
  Filled 2014-03-20: qty 10

## 2014-03-20 MED ORDER — ACETAMINOPHEN 325 MG PO TABS: 650.0000 mg | ORAL_TABLET | ORAL | Status: DC | PRN

## 2014-03-20 MED ORDER — EPHEDRINE 5 MG/ML INJ
10.0000 mg | INTRAVENOUS | Status: AC | PRN
  Administered 2014-03-20 (×2): 10 mg via INTRAVENOUS

## 2014-03-20 MED ORDER — EPHEDRINE 5 MG/ML INJ
10.0000 mg | INTRAVENOUS | Status: DC | PRN
  Filled 2014-03-20: qty 4
  Filled 2014-03-20: qty 2

## 2014-03-20 MED ORDER — LACTATED RINGERS IV SOLN
INTRAVENOUS | Status: DC
  Administered 2014-03-20: 125 mL/h via INTRAVENOUS
  Administered 2014-03-20: 15:00:00 via INTRAVENOUS
  Administered 2014-03-21: 125 mL/h via INTRAVENOUS

## 2014-03-20 MED ORDER — LACTATED RINGERS IV SOLN: 500.0000 mL | Freq: Once | INTRAVENOUS | Status: DC

## 2014-03-20 MED ORDER — DIPHENHYDRAMINE HCL 50 MG/ML IJ SOLN: 12.5000 mg | INTRAMUSCULAR | Status: DC | PRN

## 2014-03-20 MED ORDER — PHENYLEPHRINE 40 MCG/ML (10ML) SYRINGE FOR IV PUSH (FOR BLOOD PRESSURE SUPPORT)
80.0000 ug | PREFILLED_SYRINGE | INTRAVENOUS | Status: DC | PRN
  Filled 2014-03-20: qty 2

## 2014-03-20 MED ORDER — BUPIVACAINE HCL (PF) 0.25 % IJ SOLN
INTRAMUSCULAR | Status: DC | PRN
  Administered 2014-03-20: 8 mL via EPIDURAL
  Administered 2014-03-20: 10 mL via EPIDURAL

## 2014-03-20 MED ORDER — OXYTOCIN BOLUS FROM INFUSION
500.0000 mL | INTRAVENOUS | Status: DC
  Administered 2014-03-21: 500 mL via INTRAVENOUS

## 2014-03-20 MED ORDER — LIDOCAINE HCL (PF) 1 % IJ SOLN
30.0000 mL | INTRAMUSCULAR | Status: AC | PRN
  Administered 2014-03-21: 30 mL via SUBCUTANEOUS
  Filled 2014-03-20: qty 30

## 2014-03-20 MED ORDER — ONDANSETRON HCL 4 MG/2ML IJ SOLN: 4.0000 mg | Freq: Four times a day (QID) | INTRAMUSCULAR | Status: DC | PRN

## 2014-03-20 MED ORDER — FENTANYL 2.5 MCG/ML BUPIVACAINE 1/10 % EPIDURAL INFUSION (WH - ANES)
14.0000 mL/h | INTRAMUSCULAR | Status: DC | PRN
  Administered 2014-03-20 – 2014-03-21 (×3): 14 mL/h via EPIDURAL
  Filled 2014-03-20 (×3): qty 125

## 2014-03-20 MED ORDER — CITRIC ACID-SODIUM CITRATE 334-500 MG/5ML PO SOLN: 30.0000 mL | ORAL | Status: DC | PRN

## 2014-03-20 MED ORDER — OXYCODONE-ACETAMINOPHEN 5-325 MG PO TABS
1.0000 | ORAL_TABLET | ORAL | Status: DC | PRN
Start: 2014-03-20 — End: 2014-03-21

## 2014-03-20 MED ORDER — FLEET ENEMA 7-19 GM/118ML RE ENEM: 1.0000 | ENEMA | RECTAL | Status: DC | PRN

## 2014-03-20 MED ORDER — FENTANYL CITRATE 0.05 MG/ML IJ SOLN: 100.0000 ug | INTRAMUSCULAR | Status: DC | PRN

## 2014-03-20 MED ORDER — OXYTOCIN 40 UNITS IN LACTATED RINGERS INFUSION - SIMPLE MED
62.5000 mL/h | INTRAVENOUS | Status: DC
  Filled 2014-03-20 (×2): qty 1000

## 2014-03-20 MED ORDER — FENTANYL CITRATE 0.05 MG/ML IJ SOLN
INTRAMUSCULAR | Status: AC
  Filled 2014-03-20: qty 2

## 2014-03-20 MED ORDER — FENTANYL 2.5 MCG/ML BUPIVACAINE 1/10 % EPIDURAL INFUSION (WH - ANES)
INTRAMUSCULAR | Status: AC
  Filled 2014-03-20: qty 125

## 2014-03-20 NOTE — MAU Note (Signed)
Pt states ctx's back to back. Denies bleeding or lof. Was 2cm/80 in office

## 2014-03-20 NOTE — H&P (Signed)
LABOR ADMISSION HISTORY AND PHYSICAL  Morgan Hodges is a 28 y.o. female G1P0 with IUP at 2726w0d presenting for active labor. Reports active fetal movement and denies vaginal bleeding or leakage of fluid. Reports contractions the past two days but stronger and closer since last night. Continued to become closer together and increasing in intensity today.   [facility] 2824[facility]   Prenatal History/Complications:  Past Medical History: Past Medical History  Diagnosis Date  . Lymph node disorder     cervical    Past Surgical History: Past Surgical History  Procedure Laterality Date  . No past surgeries      Obstetrical History: OB History   Grav Para Term Preterm Abortions TAB SAB Ect Mult Living   1               Gynecological History: OB History   Grav Para Term Preterm Abortions TAB SAB Ect Mult Living   1               Social History: History   Social History  . Marital Status: Married    Spouse Name: N/A    Number of Children: N/A  . Years of Education: N/A   Social History Main Topics  . Smoking status: Never Smoker   . Smokeless tobacco: Never Used  . Alcohol Use: No  . Drug Use: No  . Sexual Activity: Yes    Birth Control/ Protection: None   Other Topics Concern  . None   Social History Narrative  . None    Family History: Family History  Problem Relation Age of Onset  . Cancer Mother     breast  . Cancer Maternal Grandmother     breast    Allergies: Allergies  Allergen Reactions  . Sulfa Antibiotics     Prescriptions prior to admission  Medication Sig Dispense Refill  . butalbital-acetaminophen-caffeine (FIORICET) 50-325-40 MG per tablet Take 1-2 tablets by mouth every 6 (six) hours as needed for headache.  30 tablet  1  . PRENATAL VITAMINS PO Take 1 tablet by mouth daily.         Review of Systems   All systems reviewed and negative except as stated in HPI  Blood pressure 124/72, pulse 88, temperature 97.7 F  (36.5 C), temperature source Oral, resp. rate 16, height 5\' 3"  (1.6 m), weight 66.225 kg (146 lb), last menstrual period 06/05/2013. General appearance: alert, cooperative and mild distress Lungs: clear to auscultation bilaterally Heart: regular rate and rhythm Abdomen: soft, non-tender; bowel sounds normal Extremities: Homans sign is negative, no sign of DVT Presentation: cephalic Fetal monitoringBaseline: 135 bpm and Accelerations: Reactive Uterine activityFrequency: Every 2-3 minutes SVE: no bag of water felt, able to feel hair. Nurse reports she felt bag on her exam.   Prenatal labs: ABO, Rh: A/POS/-- (10/06 1125) Antibody: NEG (02/27 0918) Rubella:   RPR: NON REAC (02/27 0918)  HBsAg: NEGATIVE (10/06 1125)  HIV: NON REACTIVE (02/27 0918)  GBS: NEGATIVE (04/24 1033)  2 hr Glucola 73//128/105 Genetic screening neg  Anatomy US normal female    No results found for this or any previous visit (from the past 24 hour(s)).  Assessment: Morgan Hodges is a 28 y.o. G1P0 at 2226w0d here for active labor.    #Labor: active  #Pain: Wants epidural.  #FWB: Category 1  #ID:  none #MOF: Breast feed #MOC: progestin only pills   Myra RudeJeremy E Schmitz 03/20/2014, 10:34 AM    I spoke  with and examined patient and agree with resident/PA/SNM's note and plan of care.  Cheral MarkerKimberly R. Allyne Hebert, CNM, WHNP-BC 03/20/2014 11:03 AM

## 2014-03-20 NOTE — Progress Notes (Signed)
Morgan Hodges is a 28 y.o. G1P0 at 2329w0d admitted for rupture of membranes, SOL.  Subjective: Patient reported discomfort with contractions to nursing. Had epidural redosed and is more comfortable at this time. No other complaints  Objective: BP 109/57  Pulse 95  Temp(Src) 98.6 F (37 C) (Oral)  Resp 18  Ht 5\' 3"  (1.6 m)  Wt 66.225 kg (146 lb)  BMI 25.87 kg/m2  SpO2 97%  LMP 06/05/2013      FHT:  FHR: 150 bpm, variability: moderate,  accelerations:  Present,  decelerations:  Absent UC:   irregular, every 3-5 minutes SVE:   Dilation: 10 Effacement (%): 100 Station: 0 Exam by:: T. Sprague RN  Labs: Lab Results  Component Value Date   WBC 16.4* 03/20/2014   HGB 14.1 03/20/2014   HCT 41.1 03/20/2014   MCV 93.8 03/20/2014   PLT 184 03/20/2014    Assessment / Plan: Spontaneous labor, progressing normally  Labor: Progressing normally Fetal Wellbeing:  Category I Pain Control:  Epidural I/D:  n/a Anticipated MOD:  NSVD  Morgan Hodges 03/20/2014, 10:54 PM

## 2014-03-20 NOTE — Progress Notes (Signed)
Patient ID: Morgan Hodges, female   DOB: [birth year], 28 y.o.   MRN: [id number] Morgan CloudMindy S Hodges is a 28 y.o. G1P0 at 2740w0d admitted for active labor, rupture of membranes  Subjective: Comfortable w/ epidural, no complaints  Objective: BP 101/47  Pulse 72  Temp(Src) 98.4 F (36.9 C) (Oral)  Resp 18  Ht 5\' 3"  (1.6 m)  Wt 66.225 kg (146 lb)  BMI 25.87 kg/m2  SpO2 100%  LMP 06/05/2013    FHT:  FHR: 155 bpm, variability: moderate,  accelerations:  Present,  decelerations:  Absent UC:   regular, every 1-5 minutes  SVE:   Dilation: 7 Effacement (%): 90 Station: -1 Exam by:: kbooker, cnm thick mec  Labs: Lab Results  Component Value Date   WBC 16.4* 03/20/2014   HGB 14.1 03/20/2014   HCT 41.1 03/20/2014   MCV 93.8 03/20/2014   PLT 184 03/20/2014    Assessment / Plan: Spontaneous labor, progressing normally  Labor: Progressing normally Fetal Wellbeing:  Category I Pain Control:  Epidural Pre-eclampsia: n/a I/D:  n/a Anticipated MOD:  NSVD  Cheron EveryKimberly Randall Shalon Councilman CNM, WHNP-BC 03/20/2014, 3:54 PM

## 2014-03-20 NOTE — Anesthesia Procedure Notes (Signed)
Epidural Patient location during procedure: OB Start time: 03/20/2014 11:27 AM  Staffing Anesthesiologist: Brayton CavesJACKSON, Sandie Swayze Performed by: anesthesiologist   Preanesthetic Checklist Completed: patient identified, site marked, surgical consent, pre-op evaluation, timeout performed, IV checked, risks and benefits discussed and monitors and equipment checked  Epidural Patient position: sitting Prep: site prepped and draped and DuraPrep Patient monitoring: continuous pulse ox and blood pressure Approach: midline Location: L3-L4 Injection technique: LOR air  Needle:  Needle type: Tuohy  Needle gauge: 17 G Needle length: 9 cm and 9 Needle insertion depth: 5 cm cm Catheter type: closed end flexible Catheter size: 19 Gauge Catheter at skin depth: 10 cm Test dose: negative  Assessment Events: blood not aspirated, injection not painful, no injection resistance, negative IV test and no paresthesia  Additional Notes Patient identified.  Risk benefits discussed including failed block, incomplete pain control, headache, nerve damage, paralysis, blood pressure changes, nausea, vomiting, reactions to medication both toxic or allergic, and postpartum back pain.  Patient expressed understanding and wished to proceed.  All questions were answered.  Sterile technique used throughout procedure and epidural site dressed with sterile barrier dressing. No paresthesia or other complications noted.The patient did not experience any signs of intravascular injection such as tinnitus or metallic taste in mouth nor signs of intrathecal spread such as rapid motor block. Please see nursing notes for vital signs.

## 2014-03-20 NOTE — Progress Notes (Signed)
RN requests provider see pt in MUA. Will see pt shortly

## 2014-03-20 NOTE — Progress Notes (Signed)
Morgan Hodges is a 28 y.o. G1P0 at 828w0d was admitted for active labor  Subjective: Comfortable with epidural, no complaints.   Objective: BP 114/55  Pulse 82  Temp(Src) 98.8 F (37.1 C) (Oral)  Resp 18  Ht 5\' 3"  (1.6 m)  Wt 66.225 kg (146 lb)  BMI 25.87 kg/m2  SpO2 100%  LMP 06/05/2013      FHT:  FHR: 155 bpm, variability: moderate,  accelerations:  Present,  decelerations:  Absent UC:   regular, every 1-5 minutes SVE:   Dilation: Lip/rim Effacement (%): 100 Station: 0 Exam by:: Genella RifeK. Booker CNM  Labs: Lab Results  Component Value Date   WBC 16.4* 03/20/2014   HGB 14.1 03/20/2014   HCT 41.1 03/20/2014   MCV 93.8 03/20/2014   PLT 184 03/20/2014    Assessment / Plan: Spontaneous labor, progressing normally  Labor: Progressing normally Fetal Wellbeing:  Category I Pain Control:  Epidural I/D:  n/a Anticipated MOD:  NSVD  Myra RudeJeremy E Schmitz 03/20/2014, 8:10 PM

## 2014-03-20 NOTE — MAU Note (Signed)
HMitchell, RN charge on birthing suites notified of pt who will go to room 171.

## 2014-03-20 NOTE — Anesthesia Preprocedure Evaluation (Signed)

## 2014-03-20 NOTE — Progress Notes (Signed)
I spoke with and examined patient and agree with resident/PA/SNM's note and plan of care.  Cheral MarkerKimberly R. Anacleto Batterman, CNM, [facility] 03/20/2014 9:35 PM

## 2014-03-21 ENCOUNTER — Encounter (HOSPITAL_COMMUNITY): Payer: Self-pay | Admitting: Family Medicine

## 2014-03-21 MED ORDER — PRENATAL MULTIVITAMIN CH
1.0000 | ORAL_TABLET | Freq: Every day | ORAL | Status: DC
  Administered 2014-03-21 – 2014-03-23 (×3): 1 via ORAL
  Filled 2014-03-21 (×3): qty 1

## 2014-03-21 MED ORDER — SIMETHICONE 80 MG PO CHEW: 80.0000 mg | CHEWABLE_TABLET | ORAL | Status: DC | PRN

## 2014-03-21 MED ORDER — SENNOSIDES-DOCUSATE SODIUM 8.6-50 MG PO TABS
2.0000 | ORAL_TABLET | ORAL | Status: DC
  Administered 2014-03-22 (×2): 2 via ORAL
  Filled 2014-03-21 (×2): qty 2

## 2014-03-21 MED ORDER — OXYTOCIN 40 UNITS IN LACTATED RINGERS INFUSION - SIMPLE MED
1.0000 m[IU]/min | INTRAVENOUS | Status: DC
  Administered 2014-03-21: 2 m[IU]/min via INTRAVENOUS

## 2014-03-21 MED ORDER — WITCH HAZEL-GLYCERIN EX PADS
1.0000 "application " | MEDICATED_PAD | CUTANEOUS | Status: DC | PRN
  Administered 2014-03-21 – 2014-03-22 (×2): 1 via TOPICAL

## 2014-03-21 MED ORDER — ZOLPIDEM TARTRATE 5 MG PO TABS: 5.0000 mg | ORAL_TABLET | Freq: Every evening | ORAL | Status: DC | PRN

## 2014-03-21 MED ORDER — DIPHENHYDRAMINE HCL 25 MG PO CAPS: 25.0000 mg | ORAL_CAPSULE | Freq: Four times a day (QID) | ORAL | Status: DC | PRN

## 2014-03-21 MED ORDER — ONDANSETRON HCL 4 MG/2ML IJ SOLN: 4.0000 mg | INTRAMUSCULAR | Status: DC | PRN

## 2014-03-21 MED ORDER — OXYTOCIN 10 UNIT/ML IJ SOLN
INTRAMUSCULAR | Status: AC
  Filled 2014-03-21: qty 2

## 2014-03-21 MED ORDER — LANOLIN HYDROUS EX OINT: TOPICAL_OINTMENT | CUTANEOUS | Status: DC | PRN

## 2014-03-21 MED ORDER — ONDANSETRON HCL 4 MG PO TABS: 4.0000 mg | ORAL_TABLET | ORAL | Status: DC | PRN

## 2014-03-21 MED ORDER — IBUPROFEN 600 MG PO TABS
600.0000 mg | ORAL_TABLET | Freq: Four times a day (QID) | ORAL | Status: DC
  Administered 2014-03-21 – 2014-03-23 (×8): 600 mg via ORAL
  Filled 2014-03-21 (×9): qty 1

## 2014-03-21 MED ORDER — MISOPROSTOL 200 MCG PO TABS
ORAL_TABLET | ORAL | Status: AC
  Filled 2014-03-21: qty 5

## 2014-03-21 MED ORDER — TERBUTALINE SULFATE 1 MG/ML IJ SOLN: 0.2500 mg | Freq: Once | INTRAMUSCULAR | Status: DC | PRN

## 2014-03-21 MED ORDER — OXYCODONE-ACETAMINOPHEN 5-325 MG PO TABS: 1.0000 | ORAL_TABLET | ORAL | Status: DC | PRN

## 2014-03-21 MED ORDER — FENTANYL CITRATE 0.05 MG/ML IJ SOLN
100.0000 ug | Freq: Once | INTRAMUSCULAR | Status: DC
  Filled 2014-03-21: qty 2

## 2014-03-21 MED ORDER — BENZOCAINE-MENTHOL 20-0.5 % EX AERO
1.0000 "application " | INHALATION_SPRAY | CUTANEOUS | Status: DC | PRN
  Administered 2014-03-21: 1 via TOPICAL
  Filled 2014-03-21: qty 56

## 2014-03-21 MED ORDER — DIBUCAINE 1 % RE OINT: 1.0000 "application " | TOPICAL_OINTMENT | RECTAL | Status: DC | PRN

## 2014-03-21 MED ORDER — TETANUS-DIPHTH-ACELL PERTUSSIS 5-2.5-18.5 LF-MCG/0.5 IM SUSP
0.5000 mL | Freq: Once | INTRAMUSCULAR | Status: AC
  Administered 2014-03-22: 0.5 mL via INTRAMUSCULAR
  Filled 2014-03-21: qty 0.5

## 2014-03-21 NOTE — Progress Notes (Signed)
Patient ID: Morgan Hodges, female   DOB: [birth year], 28 y.o.   MRN: [id number] Morgan Hodges is a 28 y.o. G1P0 at 6365w1d admitted for active labor, rupture of membranes  Subjective: Mostly comfortable w/ epidural, no complaints. Does have pressure/urge to push w/ uc's.   Objective: BP 115/66  Pulse 92  Temp(Src) 99.7 F (37.6 C) (Axillary)  Resp 18  Ht 5\' 3"  (1.6 m)  Wt 66.225 kg (146 lb)  BMI 25.87 kg/m2  SpO2 99%  LMP 06/05/2013    FHT:  FHR: 155 bpm, variability: moderate,  accelerations:  Present,  decelerations:  Present occ mild variable w/ pushing UC:   regular, Hodges 2-3 minutes  SVE:   Dilation: 10 Effacement (%): 100 Station: +1 Exam by:: T. Sprague RN  Pitocin @ 4 mu/min  Labs: Lab Results  Component Value Date   WBC 16.4* 03/20/2014   HGB 14.1 03/20/2014   HCT 41.1 03/20/2014   MCV 93.8 03/20/2014   PLT 184 03/20/2014    Assessment / Plan: SOL/SROM, progressed normally throughout 1st stage, 10cm @ 2200- was at 0 station, so labored down for awhile, pushed x 1hr w/o moving vtx at all-was exhausted and had irregular uc pattern, so began pitocin augmentation and let rest for about an hour. Began pushing again w/in the last hour. Vtx at +1 w/ caput, moving vtx some w/ strong pushing efforts, possibly OP- have tried bilateral exaggerated sims and hands and knees. Pelvis feels like it has room. To continue pushing, RN to notify us at total of 3hr pushing so we can re-evaluate. EFW ~7lbs by leopolds  Labor: slow 2nd stage Fetal Wellbeing:  Category II Pain Control:  Epidural Pre-eclampsia: n/a I/D:  n/a Anticipated MOD:  NSVD  Morgan EveryKimberly Hodges Morgan Hodges CNM, WHNP-BC 03/21/2014, 2:53 AM n

## 2014-03-21 NOTE — Anesthesia Postprocedure Evaluation (Signed)
Anesthesia Post Note  Patient: Morgan CloudMindy S Selvy  Procedure(s) Performed: * No procedures listed *  Anesthesia type: Epidural  Patient location: Mother/Baby  Post pain: Pain level controlled  Post assessment: Post-op Vital signs reviewed  Last Vitals:  Filed Vitals:   03/21/14 1019  BP: 107/65  Pulse: 92  Temp: 36.6 C  Resp: 18    Post vital signs: Reviewed  Level of consciousness:alert  Complications: No apparent anesthesia complications

## 2014-03-21 NOTE — Lactation Note (Signed)
This note was copied from the chart of Morgan Maia BreslowMindy Harron. Lactation Consultation Note  Patient Name: Morgan Maia BreslowMindy Naeem ZOXWR'UToday's Date: 03/21/2014 Reason for consult: Follow-up assessment;Difficult latch due to mom having short nipples and firm areolar tissue which when compressed causes nipple to flatten.  LC assisted mom to attempt to latch and used #20 NS but baby is biting and does not suck even during suck exam w/gloved finger.  She calms and is not cuing when returned STS.  LC reviewed temporary use of NS, provided #24 in case this is better fit.  FOB present and observing latch and calming technqiues.  Application and cleaning of NS reviewed.  LC assisted mom in cross-cradle and football positions and demonstrated swaddling and sssh-ing as ways to calm baby prior to latch attempts.  Mom had prolonged pushing phase and baby has head trauma which may be reason for baby's fussiness and reluctance to feed.  LC encouraged taking a break and resuming as more eager feeding cues noted.   Maternal Data    Feeding Feeding Type: Breast Fed  LATCH Score/Interventions Latch: Too sleepy or reluctant, no latch achieved, no sucking elicited. (baby biting or crying, no latch achieved) Intervention(s): Skin to skin;Teach feeding cues;Waking techniques (calming techniques needed also)  Audible Swallowing: None (expressed drops in NS and on baby's lips) Intervention(s): Skin to skin;Hand expression  Type of Nipple: Everted at rest and after stimulation (nipples short and areolar tissue firm) Intervention(s): Shells;Hand pump  Comfort (Breast/Nipple): Soft / non-tender     Hold (Positioning): Assistance needed to correctly position infant at breast and maintain latch. Intervention(s): Breastfeeding basics reviewed;Support Pillows;Position options;Skin to skin (tried cross-cradle and football on (R))  LATCH Score: 5 (LC assisted but no latch achieved)  Lactation Tools Discussed/Used Tools:  Shells;Nipple Shields Nipple shield size: 20;24;Other (comment) (#20 fit properly but mom may need larger size) Shell Type: Inverted Calming techniques Signs of proper latch  Consult Status Consult Status: Follow-up Date: 03/22/14 Follow-up type: In-patient    Zara ChessJoanne P Cyndia Degraff 03/21/2014, 6:24 PM

## 2014-03-22 NOTE — Progress Notes (Signed)
Post Partum Day 1 Subjective: up ad lib, voiding, tolerating PO and perineun continue to have significant edema and brusing but not increased since yesterday evening.  Objective: Blood pressure 100/64, pulse 99, temperature 97.9 F (36.6 C), temperature source Axillary, resp. rate 18, height 5\' 3"  (1.6 m), weight 146 lb (66.225 kg), last menstrual period 06/05/2013, SpO2 99.00%, unknown if currently breastfeeding.  Physical Exam:  General: alert, cooperative, appears stated age and no distress Lochia: appropriate Uterine Fundus: firm Incision: n/a DVT Evaluation: No evidence of DVT seen on physical exam. Negative Homan's sign. No cords or calf tenderness. No significant calf/ankle edema.   Recent Labs  03/20/14 1049  HGB 14.1  HCT 41.1    Assessment/Plan: Plan for discharge tomorrow   LOS: 2 days   Ferdie PingMarie Darlene Lawson 03/22/2014, 7:34 AM

## 2014-03-22 NOTE — Lactation Note (Signed)
This note was copied from the chart of Morgan Hodges. Lactation Consultation Note Baby 30 hours old and will not sustain latch for more than 5 min.  Attempted with and without #20NS.   Mother states she did breastfeed with NS for 20 min during the night. Baby's belly is gurgling and feels full.  Mother's left breast is cracked and right breast is bruised. Mother was able to pump 20 ml with DEBP. Using syringe, finger feed baby was able to drink 12 ml of colostrum.   Encouraged mother to do STS.   Plan is for mother to breastfeed when baby cues, using breast massage to stimulate baby. Post pump 15 min both breasts at least 4 times a day and give back baby volume based on guidelines. Reviewed pump cleaning and milk storage.     Patient Name: Morgan Hodges ZOXWR'UToday's Date: 03/22/2014 Reason for consult: Follow-up assessment   Maternal Data    Feeding Feeding Type: Breast Fed Length of feed: 5 min  LATCH Score/Interventions Latch: Repeated attempts needed to sustain latch, nipple held in mouth throughout feeding, stimulation needed to elicit sucking reflex. Intervention(s): Waking techniques;Skin to skin Intervention(s): Breast massage;Assist with latch  Audible Swallowing: None  Type of Nipple: Everted at rest and after stimulation Intervention(s): Double electric pump  Comfort (Breast/Nipple): Filling, red/small blisters or bruises, mild/mod discomfort  Problem noted: Cracked, bleeding, blisters, bruises  Hold (Positioning): No assistance needed to correctly position infant at breast.  LATCH Score: 6  Lactation Tools Discussed/Used Tools: Pump;Nipple Shields Nipple shield size: 20 Shell Type: Sore Breast pump type: Double-Electric Breast Pump Pump Review: Setup, frequency, and cleaning;Milk Storage   Consult Status Consult Status: Follow-up Date: 03/23/14 Follow-up type: In-patient    Dulce SellarRuth Boschen Berkelhammer 03/22/2014, 11:45 AM

## 2014-03-23 MED ORDER — NORETHINDRONE 0.35 MG PO TABS
1.0000 | ORAL_TABLET | Freq: Every day | ORAL | Status: DC
Start: 2014-03-23 — End: 2014-08-21

## 2014-03-23 MED ORDER — IBUPROFEN 600 MG PO TABS: 600.0000 mg | ORAL_TABLET | Freq: Four times a day (QID) | ORAL | Status: DC

## 2014-03-23 NOTE — Lactation Note (Signed)
This note was copied from the chart of Morgan Hodges Marsiglia. Lactation Consultation Note Provided mother with extra #20NS and comfort gels for soreness.  Reviewed use. Encouraged parents to call if further assistance is needed.  Patient Name: Morgan Hodges Tatum OZDGU'YToday's Date: 03/23/2014 Reason for consult: Follow-up assessment   Maternal Data    Feeding Feeding Type: Breast Fed Length of feed: 27 min  LATCH Score/Interventions Latch: Grasps breast easily, tongue down, lips flanged, rhythmical sucking. Intervention(s): Waking techniques Intervention(s): Breast massage  Audible Swallowing: A few with stimulation Intervention(s):  (NS)  Type of Nipple: Everted at rest and after stimulation Intervention(s): Double electric pump  Comfort (Breast/Nipple): Filling, red/small blisters or bruises, mild/mod discomfort  Problem noted: Cracked, bleeding, blisters, bruises Interventions  (Cracked/bleeding/bruising/blister): Expressed breast milk to nipple;Double electric pump  Hold (Positioning): No assistance needed to correctly position infant at breast.  LATCH Score: 8  Lactation Tools Discussed/Used     Consult Status      Dulce SellarRuth Boschen Dimple Bastyr 03/23/2014, 1:11 PM

## 2014-03-23 NOTE — Lactation Note (Addendum)
This note was copied from the chart of Girl Morgan BreslowMindy Mcwilliams. Lactation Consultation Note Follow up consult:  Mother has baby latched in cross cradle hold with NS.   Feeding stopped for asssement but then baby continued for 10 more min and seems satisfied. Colostrum in NS. Plan is for mother to breastfeed first with or without NS, then post pump 4-6 times a day for 15 min. Give baby back volume pumped based on volume guidelines.  Monitor voids/stools.   Mother was able to pump 10ml of colostrum. Offered parents an outpatient appointment but parents will call if they need assistance.  PEDs appointment tomorrow morning. Encouraged mother to call if further assistance is needed.   Patient Name: Girl Morgan BreslowMindy Hodges ZOXWR'UToday's Date: 03/23/2014 Reason for consult: Follow-up assessment   Maternal Data    Feeding Feeding Type: Breast Fed Length of feed: 5 min (Stopped for asssesment)  LATCH Score/Interventions Latch: Grasps breast easily, tongue down, lips flanged, rhythmical sucking. Intervention(s): Waking techniques Intervention(s): Breast massage  Audible Swallowing: A few with stimulation Intervention(s):  (NS)  Type of Nipple: Everted at rest and after stimulation Intervention(s): Double electric pump  Comfort (Breast/Nipple): Filling, red/small blisters or bruises, mild/mod discomfort  Problem noted: Cracked, bleeding, blisters, bruises Interventions  (Cracked/bleeding/bruising/blister): Expressed breast milk to nipple;Double electric pump  Hold (Positioning): No assistance needed to correctly position infant at breast.  LATCH Score: 8  Lactation Tools Discussed/Used     Consult Status      Dulce SellarRuth Boschen Norton Bivins 03/23/2014, 9:21 AM

## 2014-03-23 NOTE — Discharge Instructions (Signed)

## 2014-03-23 NOTE — Discharge Summary (Signed)
Obstetric Discharge Summary Reason for Admission: onset of labor Prenatal Procedures: ultrasound Intrapartum Procedures: spontaneous vaginal delivery Postpartum Procedures: none Complications-Operative and Postpartum: labial and sulcus laceration. Hemoglobin  Date Value Ref Range Status  03/20/2014 14.1  12.0 - 15.0 g/dL Final     HCT  Date Value Ref Range Status  03/20/2014 41.1  36.0 - 46.0 % Final    Physical Exam:  Filed Vitals:   03/23/14 0608  BP: 109/70  Pulse: 71  Temp: 97.9 F (36.6 C)  Resp: 20   General: alert, cooperative and appears stated age 2Lochia: appropriate Uterine Fundus: firm Incision: n/a DVT Evaluation: No evidence of DVT seen on physical exam. Negative Homan's sign.   Discharge Diagnoses: Term Pregnancy-delivered  Discharge Information: Date: 03/23/2014 Activity: pelvic rest Diet: routine Medications: Ibuprofen and micronor. Condition: stable and improved Instructions: refer to practice specific booklet Discharge to: home Follow-up Information   Follow up with [facility] In 6 weeks.   Contact information:   [address] [place] [id number] [phone]      Newborn Data: Live born female  Birth Weight: 7 lb 6 oz (3345 g) APGAR: 7, 9  Home with mother.  Morgan FarberWalidah N Hodges 03/23/2014, 10:32 AM

## 2014-03-24 ENCOUNTER — Encounter: Payer: 59 | Admitting: Women's Health

## 2014-04-29 ENCOUNTER — Encounter: Payer: Self-pay | Admitting: Advanced Practice Midwife

## 2014-04-29 ENCOUNTER — Ambulatory Visit (INDEPENDENT_AMBULATORY_CARE_PROVIDER_SITE_OTHER): Payer: 59 | Admitting: Advanced Practice Midwife

## 2014-04-29 NOTE — Progress Notes (Signed)
  Morgan Hodges is a 28 y.o. who presents for a postpartum visit. She is 6 weeks postpartum following a spontaneous vaginal delivery. I have fully reviewed the prenatal and intrapartum course. The delivery was at 40.1 gestational weeks.  Anesthesia: local and epidural. Postpartum course has been uneventful. Baby's course has been uneventful. Baby is feeding by breast. Bleeding: staining only. Bowel function is normal. Bladder function is normal. Patient is not sexually active. Contraception method is oral progesterone-only contraceptive. Postpartum depression screening: negative.    Review of Systems   Constitutional: Negative for fever and chills Eyes: Negative for visual disturbances Respiratory: Negative for shortness of breath, dyspnea Cardiovascular: Negative for chest pain or palpitations  Gastrointestinal: Negative for vomiting, diarrhea and constipation Genitourinary: Negative for dysuria and urgency Musculoskeletal: Negative for back pain, joint pain, myalgias  Neurological: Negative for dizziness and headaches   Objective:     Filed Vitals:   04/29/14 1012  BP: 108/70   General:  alert, cooperative and no distress   Breasts:  negative  Lungs: clear to auscultation bilaterally  Heart:  regular rate and rhythm  Abdomen: Soft, nontender   Vulva:  normal.  Tear on left still has a small raw area.  Vagina: normal vagina  Cervix:  closed  Corpus: Well involuted     Rectal Exam: no hemorrhoids        Assessment:    normal postpartum exam.  Plan:    1. Contraception: oral progesterone-only contraceptive (B/U 3 weeks) 2. Follow up in: 2 weeks for silver nitrate on vagina if not healed  or as needed.

## 2014-06-01 ENCOUNTER — Telehealth: Payer: Self-pay | Admitting: *Deleted

## 2014-06-01 NOTE — Telephone Encounter (Signed)
Pt states wants a return to work note for August 11,2015. Pt states delivered 03/21/2014 SVD. Postpartum visit done on 04/29/2014. Pt states took 12 weeks FMLA. Note left at front for pick up.

## 2014-08-21 ENCOUNTER — Other Ambulatory Visit: Payer: Self-pay | Admitting: Family Medicine

## 2014-09-06 ENCOUNTER — Encounter: Payer: Self-pay | Admitting: Advanced Practice Midwife

## 2014-11-15 ENCOUNTER — Other Ambulatory Visit: Payer: Self-pay | Admitting: Family Medicine

## 2015-02-10 ENCOUNTER — Other Ambulatory Visit: Payer: Self-pay | Admitting: Family Medicine

## 2015-03-09 ENCOUNTER — Other Ambulatory Visit (HOSPITAL_COMMUNITY)
Admission: RE | Admit: 2015-03-09 | Discharge: 2015-03-09 | Disposition: A | Discharge status: Home / Self Care | Payer: 59 | Source: Ambulatory Visit | Attending: Adult Health | Admitting: Adult Health

## 2015-03-09 ENCOUNTER — Ambulatory Visit (INDEPENDENT_AMBULATORY_CARE_PROVIDER_SITE_OTHER): Payer: 59 | Admitting: Adult Health

## 2015-03-09 ENCOUNTER — Encounter: Payer: Self-pay | Admitting: Adult Health

## 2015-03-09 VITALS — BP 98/60 | HR 64 | Ht 62.25 in | Wt 114.0 lb

## 2015-03-09 DIAGNOSIS — Z1151 Encounter for screening for human papillomavirus (HPV): Secondary | ICD-10-CM | POA: Diagnosis present

## 2015-03-09 DIAGNOSIS — Z3041 Encounter for surveillance of contraceptive pills: Secondary | ICD-10-CM

## 2015-03-09 DIAGNOSIS — Z01419 Encounter for gynecological examination (general) (routine) without abnormal findings: Secondary | ICD-10-CM | POA: Insufficient documentation

## 2015-03-09 DIAGNOSIS — Z309 Encounter for contraceptive management, unspecified: Secondary | ICD-10-CM | POA: Insufficient documentation

## 2015-03-09 MED ORDER — NORETHINDRONE 0.35 MG PO TABS: 1.0000 | ORAL_TABLET | Freq: Every day | ORAL | Status: DC

## 2015-03-09 NOTE — Patient Instructions (Signed)
Physical in 1 year Call when want COCs

## 2015-03-09 NOTE — Progress Notes (Signed)
Patient ID: [name], female   DOB: 28-Sep-1986, 29 y.o.   MRN: [id number] History of Present Illness:  [name] is a 29 year old white female, married in for well woman gyn exam and pap.She works 12 hour shifts at [employer] in [place].She is still pumping and is on POP, and has some spotting.  Current Medications, Allergies, Past Medical History, Past Surgical History, Family History and Social History were reviewed in [facility] electronic medical record.     Review of Systems: Patient denies any headaches, hearing loss, fatigue, blurred vision, shortness of breath, chest pain, abdominal pain, problems with bowel movements, urination, or intercourse. No joint pain or mood swings.    Physical Exam:BP 98/60 mmHg  Pulse 64  Ht 5' 2.25" (1.581 m)  Wt 114 lb (51.71 kg)  BMI 20.69 kg/m2  LMP 03/02/2015  Breastfeeding? Yes General:  Well developed, well nourished, no acute distress Skin:  Warm and dry Neck:  Midline trachea, normal thyroid, good ROM, no lymphadenopathy Lungs; Clear to auscultation bilaterally Breast:  No dominant palpable mass, retraction, or nipple discharge Cardiovascular: Regular rate and rhythm Abdomen:  Soft, non tender, no hepatosplenomegaly Pelvic:  External genitalia is normal in appearance, no lesions.  The vagina is normal in appearance. Urethra has no lesions or masses. The cervix is bulbous.  Uterus is felt to be normal size, shape, and contour.  No adnexal masses or tenderness noted.Bladder is non tender, no masses felt. Extremities/musculoskeletal:  No swelling or varicosities noted, no clubbing or cyanosis Psych:  No mood changes, alert and cooperative,seems happy   Impression: Well woman gyn exam with pap Contraceptive management    Plan: Refilled sharobel x 1 year Physical in 1 year, pap in 3 years if normal with negative HPV Call when stops pumping if wants COC

## 2015-03-10 LAB — CYTOLOGY - PAP

## 2015-07-21 ENCOUNTER — Telehealth: Payer: Self-pay | Admitting: *Deleted

## 2015-07-21 MED ORDER — NORETHIN ACE-ETH ESTRAD-FE 1-20 MG-MCG(24) PO TABS: 1.0000 | ORAL_TABLET | Freq: Every day | ORAL | Status: DC

## 2015-07-21 NOTE — Telephone Encounter (Signed)
Spoke with pt. Pt is no longer breast feeding and wants her birth control changed. She has been on Loestrin 24 in the past and wonders if her insurance would cover that. She has [employer]. Thanks!! JSY

## 2015-07-21 NOTE — Telephone Encounter (Signed)
Will rx loestrin 24 fe 

## 2015-08-05 ENCOUNTER — Telehealth: Payer: Self-pay | Admitting: Adult Health

## 2015-08-05 MED ORDER — NORETHINDRONE ACET-ETHINYL EST 1-20 MG-MCG PO TABS: 1.0000 | ORAL_TABLET | Freq: Every day | ORAL | Status: DC

## 2015-08-05 NOTE — Telephone Encounter (Signed)
Spoke with pt. Pt states the Loestrin is $40.00 with her insurance. Does this come in generic? If not, pt wants something that will be cheaper. Thanks!! JSY

## 2015-08-05 NOTE — Telephone Encounter (Signed)
Left message x 1. JSY 

## 2015-08-05 NOTE — Telephone Encounter (Signed)
Will try junel 1-20 

## 2015-08-05 NOTE — Telephone Encounter (Signed)
Left message x 2. JSY 

## 2015-11-06 NOTE — L&D Delivery Note (Signed)
Delivery Note At 7:08 AM a viable female was delivered via Vaginal, Spontaneous Delivery (Presentation: OA ;  ).  APGAR: 8, 9; weight  . pending  Placenta status:intact , 3v.  Cord:  with the following complications: none .    Anesthesia: epidural  Episiotomy: None Lacerations: None Suture Repair: n/a Est. Blood Loss (mL): 100  Mom to postpartum.  Baby to Couplet care / Skin to Skin.  HARRAWAY-SMITH, Alicianna Litchford 08/24/2016, 8:13 AM

## 2015-12-21 ENCOUNTER — Other Ambulatory Visit: Payer: Self-pay | Admitting: *Deleted

## 2016-01-06 ENCOUNTER — Ambulatory Visit (INDEPENDENT_AMBULATORY_CARE_PROVIDER_SITE_OTHER): Payer: Commercial Managed Care - HMO | Admitting: Adult Health

## 2016-01-06 ENCOUNTER — Encounter: Payer: Self-pay | Admitting: Adult Health

## 2016-01-06 VITALS — BP 110/60 | HR 86 | Ht 63.0 in | Wt 117.0 lb

## 2016-01-06 DIAGNOSIS — Z3201 Encounter for pregnancy test, result positive: Secondary | ICD-10-CM

## 2016-01-06 DIAGNOSIS — O3680X0 Pregnancy with inconclusive fetal viability, not applicable or unspecified: Secondary | ICD-10-CM

## 2016-01-06 DIAGNOSIS — R110 Nausea: Secondary | ICD-10-CM

## 2016-01-06 DIAGNOSIS — Z3490 Encounter for supervision of normal pregnancy, unspecified, unspecified trimester: Secondary | ICD-10-CM

## 2016-01-06 LAB — POCT URINE PREGNANCY: Preg Test, Ur: POSITIVE — AB

## 2016-01-06 MED ORDER — PRENATAL PLUS 27-1 MG PO TABS: 1.0000 | ORAL_TABLET | Freq: Every day | ORAL | Status: DC

## 2016-01-06 NOTE — Patient Instructions (Signed)
First Trimester of Pregnancy The first trimester of pregnancy is from week 1 until the end of week 12 (months 1 through 3). A week after a sperm fertilizes an egg, the egg will implant on the wall of the uterus. This embryo will begin to develop into a baby. Genes from you and your partner are forming the baby. The female genes determine whether the baby is a boy or a girl. At 6-8 weeks, the eyes and face are formed, and the heartbeat can be seen on ultrasound. At the end of 12 weeks, all the baby's organs are formed.  Now that you are pregnant, you will want to do everything you can to have a healthy baby. Two of the most important things are to get good prenatal care and to follow your health care provider's instructions. Prenatal care is all the medical care you receive before the baby's birth. This care will help prevent, find, and treat any problems during the pregnancy and childbirth. BODY CHANGES Your body goes through many changes during pregnancy. The changes vary from woman to woman.   You may gain or lose a couple of pounds at first.  You may feel sick to your stomach (nauseous) and throw up (vomit). If the vomiting is uncontrollable, call your health care provider.  You may tire easily.  You may develop headaches that can be relieved by medicines approved by your health care provider.  You may urinate more often. Painful urination may mean you have a bladder infection.  You may develop heartburn as a result of your pregnancy.  You may develop constipation because certain hormones are causing the muscles that push waste through your intestines to slow down.  You may develop hemorrhoids or swollen, bulging veins (varicose veins).  Your breasts may begin to grow larger and become tender. Your nipples may stick out more, and the tissue that surrounds them (areola) may become darker.  Your gums may bleed and may be sensitive to brushing and flossing.  Dark spots or blotches (chloasma,  mask of pregnancy) may develop on your face. This will likely fade after the baby is born.  Your menstrual periods will stop.  You may have a loss of appetite.  You may develop cravings for certain kinds of food.  You may have changes in your emotions from day to day, such as being excited to be pregnant or being concerned that something may go wrong with the pregnancy and baby.  You may have more vivid and strange dreams.  You may have changes in your hair. These can include thickening of your hair, rapid growth, and changes in texture. Some women also have hair loss during or after pregnancy, or hair that feels dry or thin. Your hair will most likely return to normal after your baby is born. WHAT TO EXPECT AT YOUR PRENATAL VISITS During a routine prenatal visit:  You will be weighed to make sure you and the baby are growing normally.  Your blood pressure will be taken.  Your abdomen will be measured to track your baby's growth.  The fetal heartbeat will be listened to starting around week 10 or 12 of your pregnancy.  Test results from any previous visits will be discussed. Your health care provider may ask you:  How you are feeling.  If you are feeling the baby move.  If you have had any abnormal symptoms, such as leaking fluid, bleeding, severe headaches, or abdominal cramping.  If you are using any tobacco products,   including cigarettes, chewing tobacco, and electronic cigarettes.  If you have any questions. Other tests that may be performed during your first trimester include:  Blood tests to find your blood type and to check for the presence of any previous infections. They will also be used to check for low iron levels (anemia) and Rh antibodies. Later in the pregnancy, blood tests for diabetes will be done along with other tests if problems develop.  Urine tests to check for infections, diabetes, or protein in the urine.  An ultrasound to confirm the proper growth  and development of the baby.  An amniocentesis to check for possible genetic problems.  Fetal screens for spina bifida and Down syndrome.  You may need other tests to make sure you and the baby are doing well.  HIV (human immunodeficiency virus) testing. Routine prenatal testing includes screening for HIV, unless you choose not to have this test. HOME CARE INSTRUCTIONS  Medicines  Follow your health care provider's instructions regarding medicine use. Specific medicines may be either safe or unsafe to take during pregnancy.  Take your prenatal vitamins as directed.  If you develop constipation, try taking a stool softener if your health care provider approves. Diet  Eat regular, well-balanced meals. Choose a variety of foods, such as meat or vegetable-based protein, fish, milk and low-fat dairy products, vegetables, fruits, and whole grain breads and cereals. Your health care provider will help you determine the amount of weight gain that is right for you.  Avoid raw meat and uncooked cheese. These carry germs that can cause birth defects in the baby.  Eating four or five small meals rather than three large meals a day may help relieve nausea and vomiting. If you start to feel nauseous, eating a few soda crackers can be helpful. Drinking liquids between meals instead of during meals also seems to help nausea and vomiting.  If you develop constipation, eat more high-fiber foods, such as fresh vegetables or fruit and whole grains. Drink enough fluids to keep your urine clear or pale yellow. Activity and Exercise  Exercise only as directed by your health care provider. Exercising will help you:  Control your weight.  Stay in shape.  Be prepared for labor and delivery.  Experiencing pain or cramping in the lower abdomen or low back is a good sign that you should stop exercising. Check with your health care provider before continuing normal exercises.  Try to avoid standing for long  periods of time. Move your legs often if you must stand in one place for a long time.  Avoid heavy lifting.  Wear low-heeled shoes, and practice good posture.  You may continue to have sex unless your health care provider directs you otherwise. Relief of Pain or Discomfort  Wear a good support bra for breast tenderness.   Take warm sitz baths to soothe any pain or discomfort caused by hemorrhoids. Use hemorrhoid cream if your health care provider approves.   Rest with your legs elevated if you have leg cramps or low back pain.  If you develop varicose veins in your legs, wear support hose. Elevate your feet for 15 minutes, 3-4 times a day. Limit salt in your diet. Prenatal Care  Schedule your prenatal visits by the twelfth week of pregnancy. They are usually scheduled monthly at first, then more often in the last 2 months before delivery.  Write down your questions. Take them to your prenatal visits.  Keep all your prenatal visits as directed by your   health care provider. Safety  Wear your seat belt at all times when driving.  Make a list of emergency phone numbers, including numbers for family, friends, the hospital, and police and fire departments. General Tips  Ask your health care provider for a referral to a local prenatal education class. Begin classes no later than at the beginning of month 6 of your pregnancy.  Ask for help if you have counseling or nutritional needs during pregnancy. Your health care provider can offer advice or refer you to specialists for help with various needs.  Do not use hot tubs, steam rooms, or saunas.  Do not douche or use tampons or scented sanitary pads.  Do not cross your legs for long periods of time.  Avoid cat litter boxes and soil used by cats. These carry germs that can cause birth defects in the baby and possibly loss of the fetus by miscarriage or stillbirth.  Avoid all smoking, herbs, alcohol, and medicines not prescribed by  your health care provider. Chemicals in these affect the formation and growth of the baby.  Do not use any tobacco products, including cigarettes, chewing tobacco, and electronic cigarettes. If you need help quitting, ask your health care provider. You may receive counseling support and other resources to help you quit.  Schedule a dentist appointment. At home, brush your teeth with a soft toothbrush and be gentle when you floss. SEEK MEDICAL CARE IF:   You have dizziness.  You have mild pelvic cramps, pelvic pressure, or nagging pain in the abdominal area.  You have persistent nausea, vomiting, or diarrhea.  You have a bad smelling vaginal discharge.  You have pain with urination.  You notice increased swelling in your face, hands, legs, or ankles. SEEK IMMEDIATE MEDICAL CARE IF:   You have a fever.  You are leaking fluid from your vagina.  You have spotting or bleeding from your vagina.  You have severe abdominal cramping or pain.  You have rapid weight gain or loss.  You vomit blood or material that looks like coffee grounds.  You are exposed to German measles and have never had them.  You are exposed to fifth disease or chickenpox.  You develop a severe headache.  You have shortness of breath.  You have any kind of trauma, such as from a fall or a car accident.   This information is not intended to replace advice given to you by your health care provider. Make sure you discuss any questions you have with your health care provider.   Document Released: 10/16/2001 Document Revised: 11/12/2014 Document Reviewed: 09/01/2013 Elsevier Interactive Patient Education 2016 [employer] Return in 1 week for dating US  

## 2016-01-06 NOTE — Progress Notes (Signed)
Subjective:     Patient ID: Morgan Hodges, female   DOB: 1986-06-11, 30 y.o.   MRN: [id number]  HPI Morgan Hodges is a 30 year old white female, married in for UPT, has some nausea.No bleeding,  Review of Systems Patient denies any headaches, hearing loss, fatigue, blurred vision, shortness of breath, chest pain, abdominal pain, problems with bowel movements, urination, or intercourse. No joint pain or mood swings.See HPI for positives. Reviewed past medical,surgical, social and family history. Reviewed medications and allergies.     Objective:   Physical Exam BP 110/60 mmHg  Pulse 86  Ht 5\' 3"  (1.6 m)  Wt 117 lb (53.071 kg)  BMI 20.73 kg/m2  LMP 11/10/2015  Breastfeeding? No UPT + about 8+1 week by LMP with EDD 08/16/16, US shows IUP with +FHM, 149  and + yolk sac,Skin warm and dry.Lungs: clear to ausculation bilaterally. Cardiovascular: regular rate and rhythm.Abdomen soft and non tender.Try to eat often,declines meds for nausea.    Assessment:    +UPT Pregnant     Plan:     Rx prenatal plus #30 take 1 daily with 11 refills Return in 1 week for dating US Review handout on first trimester

## 2016-01-16 ENCOUNTER — Ambulatory Visit (INDEPENDENT_AMBULATORY_CARE_PROVIDER_SITE_OTHER): Payer: Commercial Managed Care - HMO

## 2016-01-16 ENCOUNTER — Other Ambulatory Visit: Payer: Commercial Managed Care - HMO

## 2016-01-16 DIAGNOSIS — O3680X0 Pregnancy with inconclusive fetal viability, not applicable or unspecified: Secondary | ICD-10-CM | POA: Diagnosis not present

## 2016-01-16 NOTE — Progress Notes (Signed)
US 9+4wks,single IUP w/ys,pos fht,normal ov's bilat,crl 23.0 mm

## 2016-01-20 ENCOUNTER — Encounter: Payer: Self-pay | Admitting: Obstetrics & Gynecology

## 2016-01-20 ENCOUNTER — Ambulatory Visit (INDEPENDENT_AMBULATORY_CARE_PROVIDER_SITE_OTHER): Payer: Commercial Managed Care - HMO | Admitting: Obstetrics & Gynecology

## 2016-01-20 VITALS — BP 110/70 | HR 92 | Temp 98.4°F | Wt 116.4 lb

## 2016-01-20 DIAGNOSIS — J069 Acute upper respiratory infection, unspecified: Secondary | ICD-10-CM

## 2016-01-20 MED ORDER — AZITHROMYCIN 250 MG PO TABS: ORAL_TABLET | ORAL | Status: DC

## 2016-01-20 NOTE — Progress Notes (Signed)
Patient ID: Morgan Hodges, female   DOB: 1986/05/29, 30 y.o.   MRN: [id number] Work in ob appt  Chief Complaint  Patient presents with  . work-in- ob    nasal congestion/coughing/ throat sore    Blood pressure 110/70, pulse 92, temperature 98.4 F (36.9 C), weight 116 lb 6.4 oz (52.799 kg), last menstrual period 11/10/2015, unknown if currently breastfeeding.  FHR 175 doppler  URI sx  Cough, productive in am, stuffy, sore throat, scratchy, history of sinus infections 2 times per year  Exam TM clear throatclear lungs clear   URI, acute  OTC meds advised  z pak

## 2016-01-27 ENCOUNTER — Encounter: Payer: Self-pay | Admitting: Adult Health

## 2016-01-27 ENCOUNTER — Ambulatory Visit (INDEPENDENT_AMBULATORY_CARE_PROVIDER_SITE_OTHER): Payer: Commercial Managed Care - HMO | Admitting: Adult Health

## 2016-01-27 VITALS — BP 110/64 | HR 78 | Wt 116.0 lb

## 2016-01-27 DIAGNOSIS — Z331 Pregnant state, incidental: Secondary | ICD-10-CM

## 2016-01-27 DIAGNOSIS — Z3481 Encounter for supervision of other normal pregnancy, first trimester: Secondary | ICD-10-CM

## 2016-01-27 DIAGNOSIS — Z0283 Encounter for blood-alcohol and blood-drug test: Secondary | ICD-10-CM

## 2016-01-27 DIAGNOSIS — Z3491 Encounter for supervision of normal pregnancy, unspecified, first trimester: Secondary | ICD-10-CM

## 2016-01-27 DIAGNOSIS — Z3490 Encounter for supervision of normal pregnancy, unspecified, unspecified trimester: Secondary | ICD-10-CM | POA: Insufficient documentation

## 2016-01-27 DIAGNOSIS — Z3682 Encounter for antenatal screening for nuchal translucency: Secondary | ICD-10-CM

## 2016-01-27 DIAGNOSIS — Z1389 Encounter for screening for other disorder: Secondary | ICD-10-CM

## 2016-01-27 DIAGNOSIS — Z369 Encounter for antenatal screening, unspecified: Secondary | ICD-10-CM

## 2016-01-27 LAB — POCT URINALYSIS DIPSTICK
Glucose, UA: NEGATIVE
Ketones, UA: NEGATIVE
Leukocytes, UA: NEGATIVE
Nitrite, UA: NEGATIVE
PROTEIN UA: NEGATIVE

## 2016-01-27 NOTE — Patient Instructions (Addendum)

## 2016-01-27 NOTE — Progress Notes (Signed)
Subjective:  Morgan Hodges is a 30 y.o. 762P1001 Caucasian female at 7972w1d by LMP being seen today for her first obstetrical visit.  Her obstetrical history is significant for had vaginal delivery 03/21/14 girl, 7 lbs 6 oz, .  Pregnancy history fully reviewed.  Patient reports nausea. Denies vb, cramping, uti s/s, abnormal/malodorous vag d/c, or vulvovaginal itching/irritation.  BP 110/64 mmHg  Pulse 78  Wt 116 lb (52.617 kg)  LMP 11/10/2015  HISTORY: OB History  Gravida Para Term Preterm AB SAB TAB Ectopic Multiple Living  2 1 1       1     # Outcome Date GA Lbr Len/2nd Weight Sex Delivery Anes PTL Lv  2 Current           1 Term 03/21/14 6937w1d 16:54 / 05:32 7 lb 6 oz (3.345 kg) F Vag-Spont Local,EPI  Y     Past Medical History  Diagnosis Date  . Lymph node disorder     cervical  . Contraceptive management 03/09/2015  . Pregnant 01/06/2016  . Supervision of normal pregnancy in first trimester 01/27/2016   Past Surgical History  Procedure Laterality Date  . No past surgeries     Family History  Problem Relation Age of Onset  . Cancer Mother     breast  . Cancer Maternal Grandmother     breast  . Stroke Paternal Grandfather   . Cancer Paternal Grandmother     stomach    Exam   System:     General: Well developed & nourished, no acute distress   Skin: Warm & dry, normal coloration and turgor, no rashes   Neurologic: Alert & oriented, normal mood   Cardiovascular: Regular rate & rhythm   Respiratory: Effort & rate normal, LCTAB, acyanotic   Abdomen: Soft, non tender   Extremities: normal strength, tone   Pelvic Exam:    Perineum: deferred   Vulva: deferred   Vagina:  deferred   Cervix: deferred   Uterus: deferred   Thin prep pap smear 03/09/15 FHR:173 via doppler   Assessment:   Pregnancy: G2P1001 Patient Active Problem List   Diagnosis Date Noted  . Supervision of normal pregnancy in first trimester 01/27/2016  . Pregnant 01/06/2016  . Contraceptive  management 03/09/2015  . Postpartum care following vaginal delivery 04/29/2014    672w1d G2P1001 New OB visit     Plan:  Initial labs drawn Continue prenatal vitamins Problem list reviewed and updated Reviewed n/v relief measures and warning s/s to report Reviewed recommended weight gain based on pre-gravid BMI Encouraged well-balanced diet Genetic Screening discussed Integrated Screen: requested Cystic fibrosis screening discussed declined Ultrasound discussed; fetal survey: requested Follow up in 1 weeks for IT/NT and then in 5 weeks for second IT draw and see provider Declines meds for nausea, eat often, try gum and hard candy  Adline PotterJennifer A. Griffin, NP 01/27/2016 12:03 PM

## 2016-01-28 LAB — PMP SCREEN PROFILE (10S), URINE
AMPHETAMINE SCRN UR: NEGATIVE ng/mL
Barbiturate Screen, Ur: NEGATIVE ng/mL
Benzodiazepine Screen, Urine: NEGATIVE ng/mL
Cannabinoids Ur Ql Scn: NEGATIVE ng/mL
Cocaine(Metab.)Screen, Urine: NEGATIVE ng/mL
Creatinine(Crt), U: 181.5 mg/dL (ref 20.0–300.0)
METHADONE SCREEN, URINE: NEGATIVE ng/mL
OXYCODONE+OXYMORPHONE UR QL SCN: NEGATIVE ng/mL
Opiate Scrn, Ur: NEGATIVE ng/mL
PCP SCRN UR: NEGATIVE ng/mL
PH UR, DRUG SCRN: 5.4 (ref 4.5–8.9)
PROPOXYPHENE SCREEN: NEGATIVE ng/mL

## 2016-01-28 LAB — URINALYSIS, ROUTINE W REFLEX MICROSCOPIC
Bilirubin, UA: NEGATIVE
Glucose, UA: NEGATIVE
Ketones, UA: NEGATIVE
LEUKOCYTES UA: NEGATIVE
Nitrite, UA: NEGATIVE
PH UA: 6 (ref 5.0–7.5)
RBC UA: NEGATIVE
Specific Gravity, UA: >=1.03 — AB (ref 1.005–1.030)
Urobilinogen, Ur: 0.2 mg/dL (ref 0.2–1.0)

## 2016-01-28 LAB — CBC
Hematocrit: 40.3 % (ref 34.0–46.6)
Hemoglobin: 13.3 g/dL (ref 11.1–15.9)
MCH: 31.1 pg (ref 26.6–33.0)
MCHC: 33 g/dL (ref 31.5–35.7)
MCV: 94 fL (ref 79–97)
NRBC: 0 % (ref 0–0)
Platelets: 333 10*3/uL (ref 150–379)
RBC: 4.27 x10E6/uL (ref 3.77–5.28)
RDW: 13.7 % (ref 12.3–15.4)
WBC: 14.2 10*3/uL — AB (ref 3.4–10.8)

## 2016-01-28 LAB — ABO/RH: Rh Factor: POSITIVE

## 2016-01-28 LAB — VARICELLA ZOSTER ANTIBODY, IGG: VARICELLA: 260 {index} (ref >165)

## 2016-01-28 LAB — HEPATITIS B SURFACE ANTIGEN: HEP B S AG: NEGATIVE

## 2016-01-28 LAB — ANTIBODY SCREEN: Antibody Screen: NEGATIVE

## 2016-01-28 LAB — HIV ANTIBODY (ROUTINE TESTING W REFLEX): HIV Screen 4th Generation wRfx: NONREACTIVE

## 2016-01-28 LAB — RUBELLA SCREEN: RUBELLA: 2.89 {index} (ref >0.99)

## 2016-01-28 LAB — RPR: RPR Ser Ql: NONREACTIVE

## 2016-01-29 LAB — URINE CULTURE: Organism ID, Bacteria: NO GROWTH

## 2016-01-30 LAB — GC/CHLAMYDIA PROBE AMP
Chlamydia trachomatis, NAA: NEGATIVE
Neisseria gonorrhoeae by PCR: NEGATIVE

## 2016-02-03 ENCOUNTER — Other Ambulatory Visit: Payer: Commercial Managed Care - HMO

## 2016-02-03 ENCOUNTER — Ambulatory Visit (INDEPENDENT_AMBULATORY_CARE_PROVIDER_SITE_OTHER): Payer: Commercial Managed Care - HMO

## 2016-02-03 DIAGNOSIS — Z369 Encounter for antenatal screening, unspecified: Secondary | ICD-10-CM

## 2016-02-03 DIAGNOSIS — Z3A13 13 weeks gestation of pregnancy: Secondary | ICD-10-CM | POA: Diagnosis not present

## 2016-02-03 DIAGNOSIS — Z3682 Encounter for antenatal screening for nuchal translucency: Secondary | ICD-10-CM

## 2016-02-03 DIAGNOSIS — Z36 Encounter for antenatal screening of mother: Secondary | ICD-10-CM

## 2016-02-03 NOTE — Progress Notes (Signed)
US 12+1wks,measurements c/w dates,crl 57.2 mm,ant pl gr 0,NB present,NT 1.0 mm,fhr 176 bpm,normal ov's bilat

## 2016-02-07 LAB — MATERNAL SCREEN, INTEGRATED #1
CROWN RUMP LENGTH MAT SCREEN: 57.2 mm
Gest. Age on Collection Date: 12.3 weeks
Maternal Age at EDD: 31.2 years
NUCHAL TRANSLUCENCY (NT): 1 mm
Number of Fetuses: 1
PAPP-A Value: 1369.7 ng/mL
Weight: 116 [lb_av]

## 2016-03-01 ENCOUNTER — Encounter: Payer: Self-pay | Admitting: Advanced Practice Midwife

## 2016-03-01 ENCOUNTER — Ambulatory Visit (INDEPENDENT_AMBULATORY_CARE_PROVIDER_SITE_OTHER): Payer: Commercial Managed Care - HMO | Admitting: Advanced Practice Midwife

## 2016-03-01 VITALS — BP 104/60 | HR 78 | Wt 118.0 lb

## 2016-03-01 DIAGNOSIS — Z331 Pregnant state, incidental: Secondary | ICD-10-CM

## 2016-03-01 DIAGNOSIS — Z369 Encounter for antenatal screening, unspecified: Secondary | ICD-10-CM

## 2016-03-01 DIAGNOSIS — Z3A16 16 weeks gestation of pregnancy: Secondary | ICD-10-CM

## 2016-03-01 DIAGNOSIS — Z363 Encounter for antenatal screening for malformations: Secondary | ICD-10-CM

## 2016-03-01 DIAGNOSIS — Z1389 Encounter for screening for other disorder: Secondary | ICD-10-CM

## 2016-03-01 DIAGNOSIS — Z3492 Encounter for supervision of normal pregnancy, unspecified, second trimester: Secondary | ICD-10-CM

## 2016-03-01 LAB — POCT URINALYSIS DIPSTICK
Blood, UA: NEGATIVE
Glucose, UA: NEGATIVE
Ketones, UA: NEGATIVE
LEUKOCYTES UA: NEGATIVE
Nitrite, UA: NEGATIVE
PROTEIN UA: NEGATIVE

## 2016-03-01 NOTE — Progress Notes (Signed)
G2P1001 3074w0d Estimated Date of Delivery: 08/16/16  Blood pressure 104/60, pulse 78, weight 118 lb (53.524 kg), last menstrual period 11/10/2015, unknown if currently breastfeeding.   BP weight and urine results all reviewed and noted.  Please refer to the obstetrical flow sheet for the fundal height and fetal heart rate documentation:  Patient reports good fetal movement, denies any bleeding and no rupture of membranes symptoms or regular contractions. Patient is without complaints. All questions were answered.  Orders Placed This Encounter  Procedures  . US OB Comp + 14 Wk  . Maternal Screen, Integrated #2  . POCT Urinalysis Dipstick    Plan:  Continued routine obstetrical care, 2nd IT today  Return in about 4 weeks (around 03/29/2016) for LROB, WU:[id number]:Anatomy.

## 2016-03-04 LAB — MATERNAL SCREEN, INTEGRATED #2
AFP MoM: 1.31
Alpha-Fetoprotein: 47.8 ng/mL
Crown Rump Length: 57.2 mm
DIA MoM: 1.04
DIA VALUE: 216.2 pg/mL
Estriol, Unconjugated: 0.84 ng/mL
Gest. Age on Collection Date: 12.3 weeks
Gestational Age: 16.1 weeks
HCG VALUE: 97.1 [IU]/mL
Maternal Age at EDD: 31.2 years
NUCHAL TRANSLUCENCY (NT): 1 mm
NUCHAL TRANSLUCENCY MOM: 0.67
Number of Fetuses: 1
PAPP-A MoM: 1.16
PAPP-A VALUE: 1369.7 ng/mL
TEST RESULTS: NEGATIVE
WEIGHT: 116 [lb_av]
Weight: 116 [lb_av]
hCG MoM: 2.43
uE3 MoM: 0.95

## 2016-03-28 ENCOUNTER — Ambulatory Visit (INDEPENDENT_AMBULATORY_CARE_PROVIDER_SITE_OTHER): Payer: Commercial Managed Care - HMO | Admitting: Women's Health

## 2016-03-28 ENCOUNTER — Ambulatory Visit (INDEPENDENT_AMBULATORY_CARE_PROVIDER_SITE_OTHER): Payer: Commercial Managed Care - HMO

## 2016-03-28 ENCOUNTER — Encounter: Payer: Self-pay | Admitting: Women's Health

## 2016-03-28 VITALS — BP 102/60 | HR 72 | Wt 123.0 lb

## 2016-03-28 DIAGNOSIS — Z3A20 20 weeks gestation of pregnancy: Secondary | ICD-10-CM

## 2016-03-28 DIAGNOSIS — O321XX1 Maternal care for breech presentation, fetus 1: Secondary | ICD-10-CM

## 2016-03-28 DIAGNOSIS — Z3492 Encounter for supervision of normal pregnancy, unspecified, second trimester: Secondary | ICD-10-CM

## 2016-03-28 DIAGNOSIS — Z36 Encounter for antenatal screening of mother: Secondary | ICD-10-CM

## 2016-03-28 DIAGNOSIS — Z1389 Encounter for screening for other disorder: Secondary | ICD-10-CM

## 2016-03-28 DIAGNOSIS — Z3482 Encounter for supervision of other normal pregnancy, second trimester: Secondary | ICD-10-CM

## 2016-03-28 DIAGNOSIS — Z331 Pregnant state, incidental: Secondary | ICD-10-CM

## 2016-03-28 DIAGNOSIS — Z363 Encounter for antenatal screening for malformations: Secondary | ICD-10-CM

## 2016-03-28 LAB — POCT URINALYSIS DIPSTICK
Glucose, UA: NEGATIVE
KETONES UA: NEGATIVE
Leukocytes, UA: NEGATIVE
NITRITE UA: NEGATIVE
Protein, UA: NEGATIVE
RBC UA: NEGATIVE

## 2016-03-28 NOTE — Progress Notes (Signed)
Low-risk OB appointment G2P1001 261w6d Estimated Date of Delivery: 08/16/16 BP 102/60 mmHg  Pulse 72  Wt 123 lb (55.792 kg)  LMP 11/10/2015  BP, weight, and urine reviewed.  Refer to obstetrical flow sheet for FH & FHR.  Reports good fm.  Denies regular uc's, lof, vb, or uti s/s. Some tenderness of abdomen during u/s. No obvious abnormalities, possible very small umbilical hernia.  Reviewed today's normal anatomy u/s-limited views of spine- will repeat at 28wks. Discussed warning s/s to report, fm. Plan:  Continue routine obstetrical care  F/U in 4wks for OB appointment

## 2016-03-28 NOTE — Patient Instructions (Signed)

## 2016-03-28 NOTE — Progress Notes (Signed)
Anatomy US today at 19+[redacted] weeks GA.  Single, female fetus in a breech presentation. Cervix is closed and measures 4.5 cm.  Fluid is subjectively within normal limits with SVP 4.9 cm.  Anterior Gr 0 placenta. Bilateral ovaries appear normal. EFW 274 g which is consistent with dating.  Spine not well visualized today. Recommend repeat US for better spine visualization. All other anatomy visualized and appears normal today.

## 2016-04-27 ENCOUNTER — Encounter: Payer: Commercial Managed Care - HMO | Admitting: Obstetrics & Gynecology

## 2016-05-03 ENCOUNTER — Ambulatory Visit (INDEPENDENT_AMBULATORY_CARE_PROVIDER_SITE_OTHER): Payer: Commercial Managed Care - HMO | Admitting: Obstetrics & Gynecology

## 2016-05-03 ENCOUNTER — Encounter: Payer: Self-pay | Admitting: Obstetrics & Gynecology

## 2016-05-03 VITALS — BP 100/60 | HR 84 | Wt 128.0 lb

## 2016-05-03 DIAGNOSIS — Z1389 Encounter for screening for other disorder: Secondary | ICD-10-CM

## 2016-05-03 DIAGNOSIS — Z3A25 25 weeks gestation of pregnancy: Secondary | ICD-10-CM

## 2016-05-03 DIAGNOSIS — Z331 Pregnant state, incidental: Secondary | ICD-10-CM

## 2016-05-03 DIAGNOSIS — Z3482 Encounter for supervision of other normal pregnancy, second trimester: Secondary | ICD-10-CM

## 2016-05-03 DIAGNOSIS — Z3492 Encounter for supervision of normal pregnancy, unspecified, second trimester: Secondary | ICD-10-CM

## 2016-05-03 LAB — POCT URINALYSIS DIPSTICK
Blood, UA: NEGATIVE
GLUCOSE UA: NEGATIVE
Ketones, UA: NEGATIVE
LEUKOCYTES UA: NEGATIVE
Nitrite, UA: NEGATIVE
Protein, UA: NEGATIVE

## 2016-05-03 NOTE — Progress Notes (Signed)
G2P1001 4162w0d Estimated Date of Delivery: 08/16/16  Blood pressure 100/60, pulse 84, weight 128 lb (58.06 kg), last menstrual period 11/10/2015, unknown if currently breastfeeding.   BP weight and urine results all reviewed and noted.  Please refer to the obstetrical flow sheet for the fundal height and fetal heart rate documentation:  Patient reports good fetal movement, denies any bleeding and no rupture of membranes symptoms or regular contractions. Patient is without complaints. All questions were answered.  Orders Placed This Encounter  Procedures  . POCT urinalysis dipstick    Plan:  Continued routine obstetrical care, PN2 + repeat sonogram next visit  No Follow-up on file.

## 2016-05-25 ENCOUNTER — Other Ambulatory Visit: Payer: Self-pay | Admitting: Obstetrics & Gynecology

## 2016-05-25 DIAGNOSIS — IMO0002 Reserved for concepts with insufficient information to code with codable children: Secondary | ICD-10-CM

## 2016-05-25 DIAGNOSIS — Z0489 Encounter for examination and observation for other specified reasons: Secondary | ICD-10-CM

## 2016-05-28 ENCOUNTER — Other Ambulatory Visit: Payer: Commercial Managed Care - HMO

## 2016-05-28 ENCOUNTER — Encounter: Payer: Self-pay | Admitting: Women's Health

## 2016-05-28 ENCOUNTER — Ambulatory Visit (INDEPENDENT_AMBULATORY_CARE_PROVIDER_SITE_OTHER): Payer: Commercial Managed Care - HMO | Admitting: Women's Health

## 2016-05-28 ENCOUNTER — Ambulatory Visit (INDEPENDENT_AMBULATORY_CARE_PROVIDER_SITE_OTHER): Payer: Commercial Managed Care - HMO

## 2016-05-28 VITALS — BP 100/60 | HR 80 | Wt 136.0 lb

## 2016-05-28 DIAGNOSIS — Z0489 Encounter for examination and observation for other specified reasons: Secondary | ICD-10-CM

## 2016-05-28 DIAGNOSIS — Z3A29 29 weeks gestation of pregnancy: Secondary | ICD-10-CM | POA: Diagnosis not present

## 2016-05-28 DIAGNOSIS — Z331 Pregnant state, incidental: Secondary | ICD-10-CM

## 2016-05-28 DIAGNOSIS — IMO0002 Reserved for concepts with insufficient information to code with codable children: Secondary | ICD-10-CM

## 2016-05-28 DIAGNOSIS — Z3483 Encounter for supervision of other normal pregnancy, third trimester: Secondary | ICD-10-CM

## 2016-05-28 DIAGNOSIS — Z131 Encounter for screening for diabetes mellitus: Secondary | ICD-10-CM

## 2016-05-28 DIAGNOSIS — Z36 Encounter for antenatal screening of mother: Secondary | ICD-10-CM

## 2016-05-28 DIAGNOSIS — Z369 Encounter for antenatal screening, unspecified: Secondary | ICD-10-CM

## 2016-05-28 DIAGNOSIS — Z3493 Encounter for supervision of normal pregnancy, unspecified, third trimester: Secondary | ICD-10-CM

## 2016-05-28 DIAGNOSIS — Z1389 Encounter for screening for other disorder: Secondary | ICD-10-CM

## 2016-05-28 LAB — POCT URINALYSIS DIPSTICK
Blood, UA: NEGATIVE
Glucose, UA: NEGATIVE
Ketones, UA: NEGATIVE
Leukocytes, UA: NEGATIVE
Nitrite, UA: NEGATIVE
Protein, UA: NEGATIVE

## 2016-05-28 NOTE — Progress Notes (Signed)
Low-risk OB appointment G2P1001 [redacted]w[redacted]d Estimated Date of Delivery: 08/16/16 BP 100/60   Pulse 80   Wt 136 lb (61.7 kg)   LMP 11/10/2015   BMI 24.09 kg/m   BP, weight, and urine reviewed.  Refer to obstetrical flow sheet for FH & FHR.  Reports good fm.  Denies regular uc's, lof, vb, or uti s/s. No complaints. Reviewed today's f/u u/s for spine anatomy- now complete and normal. Discussed ptl s/s, fkc Plan:  Continue routine obstetrical care  F/U in 4wks for OB appointment  PN2 today

## 2016-05-28 NOTE — Patient Instructions (Signed)

## 2016-05-28 NOTE — Progress Notes (Signed)
F/U OB US completed @ [redacted]w[redacted]d GA by LMP. Single active fetus with FHR 146bmp. EFW today 1168g which is consistant with LMP dating. EDD 08/16/16. Cx closed measuring 3.6 cm. Bilateral ovaries nonvisualized. AFI 11.88 cm and is subjectively WNL. Spine was visualized and appears WNL. Anterior placenta again visualized with central cord insertion. Fetus in cephalic position.

## 2016-05-29 LAB — CBC
Hematocrit: 37.3 % (ref 34.0–46.6)
Hemoglobin: 12.1 g/dL (ref 11.1–15.9)
MCH: 31.1 pg (ref 26.6–33.0)
MCHC: 32.4 g/dL (ref 31.5–35.7)
MCV: 96 fL (ref 79–97)
PLATELETS: 193 10*3/uL (ref 150–379)
RBC: 3.89 x10E6/uL (ref 3.77–5.28)
RDW: 14.3 % (ref 12.3–15.4)
WBC: 14.9 10*3/uL — AB (ref 3.4–10.8)

## 2016-05-29 LAB — HIV ANTIBODY (ROUTINE TESTING W REFLEX): HIV Screen 4th Generation wRfx: NONREACTIVE

## 2016-05-29 LAB — GLUCOSE TOLERANCE, 2 HOURS W/ 1HR
GLUCOSE, FASTING: 70 mg/dL (ref 65–91)
Glucose, 1 hour: 126 mg/dL (ref 65–179)
Glucose, 2 hour: 114 mg/dL (ref 65–152)

## 2016-05-29 LAB — RPR: RPR Ser Ql: NONREACTIVE

## 2016-05-29 LAB — ANTIBODY SCREEN: Antibody Screen: NEGATIVE

## 2016-06-25 ENCOUNTER — Ambulatory Visit (INDEPENDENT_AMBULATORY_CARE_PROVIDER_SITE_OTHER): Payer: Commercial Managed Care - HMO | Admitting: Women's Health

## 2016-06-25 ENCOUNTER — Encounter: Payer: Self-pay | Admitting: Women's Health

## 2016-06-25 VITALS — BP 118/64 | HR 92 | Wt 140.0 lb

## 2016-06-25 DIAGNOSIS — Z1389 Encounter for screening for other disorder: Secondary | ICD-10-CM

## 2016-06-25 DIAGNOSIS — Z3493 Encounter for supervision of normal pregnancy, unspecified, third trimester: Secondary | ICD-10-CM

## 2016-06-25 DIAGNOSIS — Z331 Pregnant state, incidental: Secondary | ICD-10-CM

## 2016-06-25 LAB — POCT URINALYSIS DIPSTICK
KETONES UA: NEGATIVE
Leukocytes, UA: NEGATIVE
Nitrite, UA: NEGATIVE
RBC UA: NEGATIVE

## 2016-06-25 NOTE — Patient Instructions (Signed)
Call the office ([phone]) or go to [facility] if:  You begin to have strong, frequent contractions  Your water breaks.  Sometimes it is a big gush of fluid, sometimes it is just a trickle that keeps getting your panties wet or running down your legs  You have vaginal bleeding.  It is normal to have a small amount of spotting if your cervix was checked.   You don't feel your baby moving like normal.  If you don't, get you something to eat and drink and lay down and focus on feeling your baby move.  You should feel at least 10 movements in 2 hours.  If you don't, you should call the office or go to [facility].    Preterm Labor Information Preterm labor is when labor starts at less than 37 weeks of pregnancy. The normal length of a pregnancy is 39 to 41 weeks. CAUSES Often, there is no identifiable underlying cause as to why a woman goes into preterm labor. One of the most common known causes of preterm labor is infection. Infections of the uterus, cervix, vagina, amniotic sac, bladder, kidney, or even the lungs (pneumonia) can cause labor to start. Other suspected causes of preterm labor include:   Urogenital infections, such as yeast infections and bacterial vaginosis.   Uterine abnormalities (uterine shape, uterine septum, fibroids, or bleeding from the placenta).   A cervix that has been operated on (it may fail to stay closed).   Malformations in the fetus.   Multiple gestations (twins, triplets, and so on).   Breakage of the amniotic sac.  RISK FACTORS  Having a previous history of preterm labor.   Having premature rupture of membranes (PROM).   Having a placenta that covers the opening of the cervix (placenta previa).   Having a placenta that separates from the uterus (placental abruption).   Having a cervix that is too weak to hold the fetus in the uterus (incompetent cervix).   Having too much fluid in the amniotic sac (polyhydramnios).   Taking  illegal drugs or smoking while pregnant.   Not gaining enough weight while pregnant.   Being younger than 18 and older than 31 years old.   Having a low socioeconomic status.   Being African American. SYMPTOMS Signs and symptoms of preterm labor include:   Menstrual-like cramps, abdominal pain, or back pain.  Uterine contractions that are regular, as frequent as six in an hour, regardless of their intensity (may be mild or painful).  Contractions that start on the top of the uterus and spread down to the lower abdomen and back.   A sense of increased pelvic pressure.   A watery or bloody mucus discharge that comes from the vagina.  TREATMENT Depending on the length of the pregnancy and other circumstances, your health care provider may suggest bed rest. If necessary, there are medicines that can be given to stop contractions and to mature the fetal lungs. If labor happens before 34 weeks of pregnancy, a prolonged hospital stay may be recommended. Treatment depends on the condition of both you and the fetus.  WHAT SHOULD YOU DO IF YOU THINK YOU ARE IN PRETERM LABOR? Call your health care provider right away. You will need to go to the hospital to get checked immediately. HOW CAN YOU PREVENT PRETERM LABOR IN FUTURE PREGNANCIES? You should:   Stop smoking if you smoke.  Maintain healthy weight gain and avoid chemicals and drugs that are not necessary.  Be watchful for   any type of infection.  Inform your health care provider if you have a known history of preterm labor.   This information is not intended to replace advice given to you by your health care provider. Make sure you discuss any questions you have with your health care provider.   Document Released: 01/12/2004 Document Revised: 06/24/2013 Document Reviewed: 11/24/2012 Elsevier Interactive Patient Education 2016 Elsevier Inc.  

## 2016-06-25 NOTE — Progress Notes (Signed)
Low-risk OB appointment G2P1001 3936w4d Estimated Date of Delivery: 08/16/16 BP 118/64   Pulse 92   Wt 140 lb (63.5 kg)   LMP 11/10/2015   BMI 24.80 kg/m   BP, weight, and urine reviewed.  Refer to obstetrical flow sheet for FH & FHR.  Reports good fm.  Denies regular uc's, lof, vb, or uti s/s. Feels like she pulled muscle in abdomen recently- getting better now. 1+glucosuria-Had waffles w/ syrup and chocolate milk for breakfast.  Reviewed pn2 results, ptl s/s, fkc. Plan:  Continue routine obstetrical care  F/U in 2wks for OB appointment

## 2016-07-13 ENCOUNTER — Encounter: Payer: Self-pay | Admitting: Obstetrics & Gynecology

## 2016-07-13 ENCOUNTER — Ambulatory Visit (INDEPENDENT_AMBULATORY_CARE_PROVIDER_SITE_OTHER): Payer: Commercial Managed Care - HMO | Admitting: Obstetrics & Gynecology

## 2016-07-13 VITALS — BP 120/60 | HR 92 | Wt 142.0 lb

## 2016-07-13 DIAGNOSIS — Z3493 Encounter for supervision of normal pregnancy, unspecified, third trimester: Secondary | ICD-10-CM

## 2016-07-13 DIAGNOSIS — Z1389 Encounter for screening for other disorder: Secondary | ICD-10-CM

## 2016-07-13 DIAGNOSIS — Z331 Pregnant state, incidental: Secondary | ICD-10-CM

## 2016-07-13 LAB — POCT URINALYSIS DIPSTICK
Blood, UA: NEGATIVE
Glucose, UA: NEGATIVE
KETONES UA: NEGATIVE
Leukocytes, UA: NEGATIVE
Nitrite, UA: NEGATIVE
Protein, UA: NEGATIVE

## 2016-07-13 NOTE — Progress Notes (Signed)
G2P1001 6156w1d Estimated Date of Delivery: 08/16/16  Blood pressure 120/60, pulse 92, weight 142 lb (64.4 kg), last menstrual period 11/10/2015, unknown if currently breastfeeding.   BP weight and urine results all reviewed and noted.  Please refer to the obstetrical flow sheet for the fundal height and fetal heart rate documentation:  Patient reports good fetal movement, denies any bleeding and no rupture of membranes symptoms or regular contractions. Patient is without complaints. All questions were answered.  Orders Placed This Encounter  Procedures  . POCT urinalysis dipstick    Plan:  Continued routine obstetrical care,   No Follow-up on file.

## 2016-07-25 ENCOUNTER — Encounter: Payer: Self-pay | Admitting: Obstetrics & Gynecology

## 2016-07-25 ENCOUNTER — Ambulatory Visit (INDEPENDENT_AMBULATORY_CARE_PROVIDER_SITE_OTHER): Payer: Commercial Managed Care - HMO | Admitting: Obstetrics & Gynecology

## 2016-07-25 VITALS — BP 110/60 | HR 80 | Wt 143.3 lb

## 2016-07-25 DIAGNOSIS — Z118 Encounter for screening for other infectious and parasitic diseases: Secondary | ICD-10-CM

## 2016-07-25 DIAGNOSIS — Z1159 Encounter for screening for other viral diseases: Secondary | ICD-10-CM

## 2016-07-25 DIAGNOSIS — Z3493 Encounter for supervision of normal pregnancy, unspecified, third trimester: Secondary | ICD-10-CM

## 2016-07-25 DIAGNOSIS — Z3A37 37 weeks gestation of pregnancy: Secondary | ICD-10-CM

## 2016-07-25 DIAGNOSIS — Z3685 Encounter for antenatal screening for Streptococcus B: Secondary | ICD-10-CM

## 2016-07-25 DIAGNOSIS — Z1389 Encounter for screening for other disorder: Secondary | ICD-10-CM

## 2016-07-25 DIAGNOSIS — Z331 Pregnant state, incidental: Secondary | ICD-10-CM

## 2016-07-25 DIAGNOSIS — Z3483 Encounter for supervision of other normal pregnancy, third trimester: Secondary | ICD-10-CM

## 2016-07-25 LAB — POCT URINALYSIS DIPSTICK
Blood, UA: NEGATIVE
Glucose, UA: NEGATIVE
Ketones, UA: NEGATIVE
Leukocytes, UA: NEGATIVE
NITRITE UA: NEGATIVE

## 2016-07-25 LAB — OB RESULTS CONSOLE GBS: STREP GROUP B AG: NEGATIVE

## 2016-07-25 NOTE — Progress Notes (Signed)
G2P1001 2575w6d Estimated Date of Delivery: 08/16/16  Blood pressure 110/60, pulse 80, weight 143 lb 4.8 oz (65 kg), last menstrual period 11/10/2015, unknown if currently breastfeeding.   BP weight and urine results all reviewed and noted.  Please refer to the obstetrical flow sheet for the fundal height and fetal heart rate documentation:  Patient reports good fetal movement, denies any bleeding and no rupture of membranes symptoms or regular contractions. Patient is without complaints. All questions were answered.  Orders Placed This Encounter  Procedures  . GC/Chlamydia Probe Amp  . Strep Gp B NAA  . POCT urinalysis dipstick    Plan:  Continued routine obstetrical care, GBS done  Return in about 1 week (around 08/01/2016) for LROB.

## 2016-07-26 ENCOUNTER — Encounter: Payer: Commercial Managed Care - HMO | Admitting: Obstetrics & Gynecology

## 2016-07-27 LAB — GC/CHLAMYDIA PROBE AMP
Chlamydia trachomatis, NAA: NEGATIVE
NEISSERIA GONORRHOEAE BY PCR: NEGATIVE

## 2016-07-27 LAB — STREP GP B NAA: STREP GROUP B AG: NEGATIVE

## 2016-08-02 ENCOUNTER — Ambulatory Visit (INDEPENDENT_AMBULATORY_CARE_PROVIDER_SITE_OTHER): Payer: Commercial Managed Care - HMO | Admitting: Women's Health

## 2016-08-02 VITALS — BP 102/60 | HR 82 | Wt 146.4 lb

## 2016-08-02 DIAGNOSIS — Z23 Encounter for immunization: Secondary | ICD-10-CM | POA: Diagnosis not present

## 2016-08-02 DIAGNOSIS — Z1389 Encounter for screening for other disorder: Secondary | ICD-10-CM

## 2016-08-02 DIAGNOSIS — Z3493 Encounter for supervision of normal pregnancy, unspecified, third trimester: Secondary | ICD-10-CM

## 2016-08-02 DIAGNOSIS — Z029 Encounter for administrative examinations, unspecified: Secondary | ICD-10-CM

## 2016-08-02 DIAGNOSIS — Z331 Pregnant state, incidental: Secondary | ICD-10-CM

## 2016-08-02 LAB — POCT URINALYSIS DIPSTICK
Blood, UA: NEGATIVE
Ketones, UA: NEGATIVE
LEUKOCYTES UA: NEGATIVE
Nitrite, UA: NEGATIVE

## 2016-08-02 NOTE — Patient Instructions (Signed)
Call the office ([phone]) or go to [facility] if:  You begin to have strong, frequent contractions  Your water breaks.  Sometimes it is a big gush of fluid, sometimes it is just a trickle that keeps getting your panties wet or running down your legs  You have vaginal bleeding.  It is normal to have a small amount of spotting if your cervix was checked.   You don't feel your baby moving like normal.  If you don't, get you something to eat and drink and lay down and focus on feeling your baby move.  You should feel at least 10 movements in 2 hours.  If you don't, you should call the office or go to [facility].    Braxton Hicks Contractions Contractions of the uterus can occur throughout pregnancy. Contractions are not always a sign that you are in labor.  WHAT ARE BRAXTON HICKS CONTRACTIONS?  Contractions that occur before labor are called Braxton Hicks contractions, or false labor. Toward the end of pregnancy (32-34 weeks), these contractions can develop more often and may become more forceful. This is not true labor because these contractions do not result in opening (dilatation) and thinning of the cervix. They are sometimes difficult to tell apart from true labor because these contractions can be forceful and people have different pain tolerances. You should not feel embarrassed if you go to the hospital with false labor. Sometimes, the only way to tell if you are in true labor is for your health care provider to look for changes in the cervix. If there are no prenatal problems or other health problems associated with the pregnancy, it is completely safe to be sent home with false labor and await the onset of true labor. HOW CAN YOU TELL THE DIFFERENCE BETWEEN TRUE AND FALSE LABOR? False Labor  The contractions of false labor are usually shorter and not as hard as those of true labor.   The contractions are usually irregular.   The contractions are often felt in the front of  the lower abdomen and in the groin.   The contractions may go away when you walk around or change positions while lying down.   The contractions get weaker and are shorter lasting as time goes on.   The contractions do not usually become progressively stronger, regular, and closer together as with true labor.  True Labor  Contractions in true labor last 30-70 seconds, become very regular, usually become more intense, and increase in frequency.   The contractions do not go away with walking.   The discomfort is usually felt in the top of the uterus and spreads to the lower abdomen and low back.   True labor can be determined by your health care provider with an exam. This will show that the cervix is dilating and getting thinner.  WHAT TO REMEMBER  Keep up with your usual exercises and follow other instructions given by your health care provider.   Take medicines as directed by your health care provider.   Keep your regular prenatal appointments.   Eat and drink lightly if you think you are going into labor.   If Braxton Hicks contractions are making you uncomfortable:   Change your position from lying down or resting to walking, or from walking to resting.   Sit and rest in a tub of warm water.   Drink 2-3 glasses of water. Dehydration may cause these contractions.   Do slow and deep breathing several times an hour.    WHEN SHOULD I SEEK IMMEDIATE MEDICAL CARE? Seek immediate medical care if:  Your contractions become stronger, more regular, and closer together.   You have fluid leaking or gushing from your vagina.   You have a fever.   You pass blood-tinged mucus.   You have vaginal bleeding.   You have continuous abdominal pain.   You have low back pain that you never had before.   You feel your baby's head pushing down and causing pelvic pressure.   Your baby is not moving as much as it used to.    This information is not intended to  replace advice given to you by your health care provider. Make sure you discuss any questions you have with your health care provider.   Document Released: 10/22/2005 Document Revised: 10/27/2013 Document Reviewed: 08/03/2013 Elsevier Interactive Patient Education 2016 Elsevier Inc.  

## 2016-08-02 NOTE — Progress Notes (Signed)
Low-risk OB appointment G2P1001 6579w0d Estimated Date of Delivery: 08/16/16 BP 102/60   Pulse 82   Wt 146 lb 6.4 oz (66.4 kg)   LMP 11/10/2015   BMI 25.93 kg/m   BP, weight, and urine reviewed.  Refer to obstetrical flow sheet for FH & FHR.  Reports good fm.  Denies regular uc's, lof, vb, or uti s/s. No complaints. SVE per request: 1.5/50/-2, vtx Reviewed labor s/s, fkc. Plan:  Continue routine obstetrical care  F/U in 1wk for OB appointment  Flu shot today

## 2016-08-09 ENCOUNTER — Ambulatory Visit (INDEPENDENT_AMBULATORY_CARE_PROVIDER_SITE_OTHER): Payer: Commercial Managed Care - HMO | Admitting: Women's Health

## 2016-08-09 VITALS — BP 90/48 | HR 76 | Wt 147.6 lb

## 2016-08-09 DIAGNOSIS — Z3483 Encounter for supervision of other normal pregnancy, third trimester: Secondary | ICD-10-CM

## 2016-08-09 DIAGNOSIS — Z331 Pregnant state, incidental: Secondary | ICD-10-CM

## 2016-08-09 DIAGNOSIS — Z1389 Encounter for screening for other disorder: Secondary | ICD-10-CM

## 2016-08-09 LAB — POCT URINALYSIS DIPSTICK
Blood, UA: NEGATIVE
Ketones, UA: NEGATIVE
Leukocytes, UA: NEGATIVE
Nitrite, UA: NEGATIVE

## 2016-08-09 NOTE — Patient Instructions (Signed)
Call the office ([phone]) or go to [facility] if:  You begin to have strong, frequent contractions  Your water breaks.  Sometimes it is a big gush of fluid, sometimes it is just a trickle that keeps getting your panties wet or running down your legs  You have vaginal bleeding.  It is normal to have a small amount of spotting if your cervix was checked.   You don't feel your baby moving like normal.  If you don't, get you something to eat and drink and lay down and focus on feeling your baby move.  You should feel at least 10 movements in 2 hours.  If you don't, you should call the office or go to [facility].    Braxton Hicks Contractions Contractions of the uterus can occur throughout pregnancy. Contractions are not always a sign that you are in labor.  WHAT ARE BRAXTON HICKS CONTRACTIONS?  Contractions that occur before labor are called Braxton Hicks contractions, or false labor. Toward the end of pregnancy (32-34 weeks), these contractions can develop more often and may become more forceful. This is not true labor because these contractions do not result in opening (dilatation) and thinning of the cervix. They are sometimes difficult to tell apart from true labor because these contractions can be forceful and people have different pain tolerances. You should not feel embarrassed if you go to the hospital with false labor. Sometimes, the only way to tell if you are in true labor is for your health care provider to look for changes in the cervix. If there are no prenatal problems or other health problems associated with the pregnancy, it is completely safe to be sent home with false labor and await the onset of true labor. HOW CAN YOU TELL THE DIFFERENCE BETWEEN TRUE AND FALSE LABOR? False Labor  The contractions of false labor are usually shorter and not as hard as those of true labor.   The contractions are usually irregular.   The contractions are often felt in the front of  the lower abdomen and in the groin.   The contractions may go away when you walk around or change positions while lying down.   The contractions get weaker and are shorter lasting as time goes on.   The contractions do not usually become progressively stronger, regular, and closer together as with true labor.  True Labor  Contractions in true labor last 30-70 seconds, become very regular, usually become more intense, and increase in frequency.   The contractions do not go away with walking.   The discomfort is usually felt in the top of the uterus and spreads to the lower abdomen and low back.   True labor can be determined by your health care provider with an exam. This will show that the cervix is dilating and getting thinner.  WHAT TO REMEMBER  Keep up with your usual exercises and follow other instructions given by your health care provider.   Take medicines as directed by your health care provider.   Keep your regular prenatal appointments.   Eat and drink lightly if you think you are going into labor.   If Braxton Hicks contractions are making you uncomfortable:   Change your position from lying down or resting to walking, or from walking to resting.   Sit and rest in a tub of warm water.   Drink 2-3 glasses of water. Dehydration may cause these contractions.   Do slow and deep breathing several times an hour.    WHEN SHOULD I SEEK IMMEDIATE MEDICAL CARE? Seek immediate medical care if:  Your contractions become stronger, more regular, and closer together.   You have fluid leaking or gushing from your vagina.   You have a fever.   You pass blood-tinged mucus.   You have vaginal bleeding.   You have continuous abdominal pain.   You have low back pain that you never had before.   You feel your baby's head pushing down and causing pelvic pressure.   Your baby is not moving as much as it used to.    This information is not intended to  replace advice given to you by your health care provider. Make sure you discuss any questions you have with your health care provider.   Document Released: 10/22/2005 Document Revised: 10/27/2013 Document Reviewed: 08/03/2013 Elsevier Interactive Patient Education 2016 Elsevier Inc.  

## 2016-08-09 NOTE — Progress Notes (Signed)
Low-risk OB appointment G2P1001 6563w0d Estimated Date of Delivery: 08/16/16 BP (!) 90/48   Pulse 76   Wt 147 lb 9.6 oz (67 kg)   LMP 11/10/2015   BMI 26.15 kg/m   BP, weight, and urine reviewed.  Refer to obstetrical flow sheet for FH & FHR.  Reports good fm.  Denies regular uc's, lof, vb, or uti s/s. No complaints. SVE per request: 1.5/60/-2 Reviewed labor s/s, fkc. Plan:  Continue routine obstetrical care  F/U in 1wk for OB appointment

## 2016-08-16 ENCOUNTER — Encounter: Payer: Self-pay | Admitting: Women's Health

## 2016-08-16 ENCOUNTER — Ambulatory Visit (INDEPENDENT_AMBULATORY_CARE_PROVIDER_SITE_OTHER): Payer: Commercial Managed Care - HMO | Admitting: Women's Health

## 2016-08-16 VITALS — BP 100/62 | HR 72 | Wt 149.5 lb

## 2016-08-16 DIAGNOSIS — Z331 Pregnant state, incidental: Secondary | ICD-10-CM

## 2016-08-16 DIAGNOSIS — O480 Post-term pregnancy: Secondary | ICD-10-CM

## 2016-08-16 DIAGNOSIS — Z3483 Encounter for supervision of other normal pregnancy, third trimester: Secondary | ICD-10-CM

## 2016-08-16 DIAGNOSIS — Z1389 Encounter for screening for other disorder: Secondary | ICD-10-CM

## 2016-08-16 DIAGNOSIS — Z3A40 40 weeks gestation of pregnancy: Secondary | ICD-10-CM | POA: Diagnosis not present

## 2016-08-16 LAB — POCT URINALYSIS DIPSTICK
Glucose, UA: NEGATIVE
KETONES UA: NEGATIVE
LEUKOCYTES UA: NEGATIVE
NITRITE UA: NEGATIVE

## 2016-08-16 NOTE — Patient Instructions (Addendum)
Your induction is scheduled for 10/19 @ 7:30am. Go to [facility] hospital, Maternity Admissions Unit (Emergency) entrance and let them know you are there to be induced. They will send someone from Labor & Delivery to come get you.    Call the office [phone]) or go to [facility] if:  You begin to have strong, frequent contractions  Your water breaks.  Sometimes it is a big gush of fluid, sometimes it is just a trickle that keeps getting your panties wet or running down your legs  You have vaginal bleeding.  It is normal to have a small amount of spotting if your cervix was checked.   You don't feel your baby moving like normal.  If you don't, get you something to eat and drink and lay down and focus on feeling your baby move.  You should feel at least 10 movements in 2 hours.  If you don't, you should call the office or go to [facility].    [facility] Contractions Contractions of the uterus can occur throughout pregnancy. Contractions are not always a sign that you are in labor.  WHAT ARE BRAXTON HICKS CONTRACTIONS?  Contractions that occur before labor are called Braxton Hicks contractions, or false labor. Toward the end of pregnancy (32-34 weeks), these contractions can develop more often and may become more forceful. This is not true labor because these contractions do not result in opening (dilatation) and thinning of the cervix. They are sometimes difficult to tell apart from true labor because these contractions can be forceful and people have different pain tolerances. You should not feel embarrassed if you go to the hospital with false labor. Sometimes, the only way to tell if you are in true labor is for your health care provider to look for changes in the cervix. If there are no prenatal problems or other health problems associated with the pregnancy, it is completely safe to be sent home with false labor and await the onset of true labor. HOW CAN YOU TELL THE DIFFERENCE  BETWEEN TRUE AND FALSE LABOR? False Labor  The contractions of false labor are usually shorter and not as hard as those of true labor.   The contractions are usually irregular.   The contractions are often felt in the front of the lower abdomen and in the groin.   The contractions may go away when you walk around or change positions while lying down.   The contractions get weaker and are shorter lasting as time goes on.   The contractions do not usually become progressively stronger, regular, and closer together as with true labor.  True Labor  Contractions in true labor last 30-70 seconds, become very regular, usually become more intense, and increase in frequency.   The contractions do not go away with walking.   The discomfort is usually felt in the top of the uterus and spreads to the lower abdomen and low back.   True labor can be determined by your health care provider with an exam. This will show that the cervix is dilating and getting thinner.  WHAT TO REMEMBER  Keep up with your usual exercises and follow other instructions given by your health care provider.   Take medicines as directed by your health care provider.   Keep your regular prenatal appointments.   Eat and drink lightly if you think you are going into labor.   If Braxton Hicks contractions are making you uncomfortable:   Change your position from lying down or  resting to walking, or from walking to resting.   Sit and rest in a tub of warm water.   Drink 2-3 glasses of water. Dehydration may cause these contractions.   Do slow and deep breathing several times an hour.  WHEN SHOULD I SEEK IMMEDIATE MEDICAL CARE? Seek immediate medical care if:  Your contractions become stronger, more regular, and closer together.   You have fluid leaking or gushing from your vagina.   You have a fever.   You pass blood-tinged mucus.   You have vaginal bleeding.   You have continuous  abdominal pain.   You have low back pain that you never had before.   You feel your baby's head pushing down and causing pelvic pressure.   Your baby is not moving as much as it used to.    This information is not intended to replace advice given to you by your health care provider. Make sure you discuss any questions you have with your health care provider.   Document Released: 10/22/2005 Document Revised: 10/27/2013 Document Reviewed: 08/03/2013 Elsevier Interactive Patient Education [employer] Elsevier Inc.

## 2016-08-16 NOTE — Progress Notes (Signed)
Low-risk OB appointment G2P1001 5572w0d Estimated Date of Delivery: 08/16/16 BP 100/62   Pulse 72   Wt 149 lb 8 oz (67.8 kg)   LMP 11/10/2015   BMI 26.48 kg/m   BP, weight, and urine reviewed.  Refer to obstetrical flow sheet for FH & FHR.  Reports good fm.  Denies regular uc's, lof, vb, or uti s/s. Occ tingling/numbness feet- good pedal pulses/blood flow/color.  SVE per request: 1.5/60/-2, vtx, wants membranes swept, discussed risks/benefits, membranes swept, now 2/60/-2 Reviewed labor s/s, fkc. Plan:  Continue routine obstetrical care  F/U in 4-6wks for pp visit I[facility] for postdates scheduled for 10/19 @ 0730

## 2016-08-20 ENCOUNTER — Telehealth (HOSPITAL_COMMUNITY): Payer: Self-pay | Admitting: *Deleted

## 2016-08-20 NOTE — Telephone Encounter (Signed)
Preadmission screen  

## 2016-08-23 ENCOUNTER — Inpatient Hospital Stay (HOSPITAL_COMMUNITY)
Admission: RE | Admit: 2016-08-23 | Discharge: 2016-08-26 | DRG: 775 | Disposition: A | Discharge status: Home / Self Care | Payer: Commercial Managed Care - HMO | Source: Ambulatory Visit | Attending: Obstetrics & Gynecology | Admitting: Obstetrics & Gynecology

## 2016-08-23 ENCOUNTER — Inpatient Hospital Stay (HOSPITAL_COMMUNITY): Payer: Commercial Managed Care - HMO | Admitting: Anesthesiology

## 2016-08-23 ENCOUNTER — Encounter (HOSPITAL_COMMUNITY): Payer: Self-pay

## 2016-08-23 VITALS — BP 98/63 | HR 70 | Temp 97.5°F | Resp 17 | Ht 63.0 in | Wt 150.0 lb

## 2016-08-23 DIAGNOSIS — Z823 Family history of stroke: Secondary | ICD-10-CM

## 2016-08-23 DIAGNOSIS — O480 Post-term pregnancy: Principal | ICD-10-CM | POA: Diagnosis present

## 2016-08-23 DIAGNOSIS — Z3A41 41 weeks gestation of pregnancy: Secondary | ICD-10-CM | POA: Diagnosis not present

## 2016-08-23 DIAGNOSIS — Z3483 Encounter for supervision of other normal pregnancy, third trimester: Secondary | ICD-10-CM

## 2016-08-23 LAB — CBC
HCT: 38.9 % (ref 36.0–46.0)
Hemoglobin: 13.5 g/dL (ref 12.0–15.0)
MCH: 31.5 pg (ref 26.0–34.0)
MCHC: 34.7 g/dL (ref 30.0–36.0)
MCV: 90.7 fL (ref 78.0–100.0)
Platelets: 199 10*3/uL (ref 150–400)
RBC: 4.29 MIL/uL (ref 3.87–5.11)
RDW: 13.5 % (ref 11.5–15.5)
WBC: 11.8 10*3/uL — AB (ref 4.0–10.5)

## 2016-08-23 LAB — TYPE AND SCREEN
ABO/RH(D): A POS
ANTIBODY SCREEN: NEGATIVE

## 2016-08-23 LAB — RPR: RPR Ser Ql: NONREACTIVE

## 2016-08-23 LAB — ABO/RH: ABO/RH(D): A POS

## 2016-08-23 MED ORDER — LACTATED RINGERS IV SOLN: 500.0000 mL | INTRAVENOUS | Status: DC | PRN

## 2016-08-23 MED ORDER — PHENYLEPHRINE 40 MCG/ML (10ML) SYRINGE FOR IV PUSH (FOR BLOOD PRESSURE SUPPORT): 80.0000 ug | PREFILLED_SYRINGE | INTRAVENOUS | Status: DC | PRN

## 2016-08-23 MED ORDER — ONDANSETRON HCL 4 MG/2ML IJ SOLN: 4.0000 mg | Freq: Four times a day (QID) | INTRAMUSCULAR | Status: DC | PRN

## 2016-08-23 MED ORDER — OXYTOCIN 40 UNITS IN LACTATED RINGERS INFUSION - SIMPLE MED
2.5000 [IU]/h | INTRAVENOUS | Status: DC
  Filled 2016-08-23: qty 1000

## 2016-08-23 MED ORDER — LACTATED RINGERS IV SOLN: 500.0000 mL | Freq: Once | INTRAVENOUS | Status: DC

## 2016-08-23 MED ORDER — FENTANYL 2.5 MCG/ML BUPIVACAINE 1/10 % EPIDURAL INFUSION (WH - ANES)
14.0000 mL/h | INTRAMUSCULAR | Status: DC | PRN
  Administered 2016-08-23 – 2016-08-24 (×2): 14 mL/h via EPIDURAL
  Filled 2016-08-23 (×2): qty 125

## 2016-08-23 MED ORDER — OXYTOCIN 40 UNITS IN LACTATED RINGERS INFUSION - SIMPLE MED
1.0000 m[IU]/min | INTRAVENOUS | Status: DC
  Administered 2016-08-23: 2 m[IU]/min via INTRAVENOUS

## 2016-08-23 MED ORDER — EPHEDRINE 5 MG/ML INJ
10.0000 mg | INTRAVENOUS | Status: DC | PRN
  Filled 2016-08-23: qty 4

## 2016-08-23 MED ORDER — PHENYLEPHRINE 40 MCG/ML (10ML) SYRINGE FOR IV PUSH (FOR BLOOD PRESSURE SUPPORT)
80.0000 ug | PREFILLED_SYRINGE | INTRAVENOUS | Status: DC | PRN
  Filled 2016-08-23: qty 5

## 2016-08-23 MED ORDER — SOD CITRATE-CITRIC ACID 500-334 MG/5ML PO SOLN: 30.0000 mL | ORAL | Status: DC | PRN

## 2016-08-23 MED ORDER — PHENYLEPHRINE 40 MCG/ML (10ML) SYRINGE FOR IV PUSH (FOR BLOOD PRESSURE SUPPORT)
80.0000 ug | PREFILLED_SYRINGE | INTRAVENOUS | Status: DC | PRN
  Filled 2016-08-23: qty 5
  Filled 2016-08-23: qty 10

## 2016-08-23 MED ORDER — TERBUTALINE SULFATE 1 MG/ML IJ SOLN
0.2500 mg | Freq: Once | INTRAMUSCULAR | Status: DC | PRN
  Filled 2016-08-23: qty 1

## 2016-08-23 MED ORDER — LIDOCAINE HCL (PF) 1 % IJ SOLN
INTRAMUSCULAR | Status: DC | PRN
  Administered 2016-08-23: 5 mL

## 2016-08-23 MED ORDER — EPHEDRINE 5 MG/ML INJ: 10.0000 mg | INTRAVENOUS | Status: DC | PRN

## 2016-08-23 MED ORDER — DIPHENHYDRAMINE HCL 50 MG/ML IJ SOLN: 12.5000 mg | INTRAMUSCULAR | Status: DC | PRN

## 2016-08-23 MED ORDER — LACTATED RINGERS IV SOLN
INTRAVENOUS | Status: DC
  Administered 2016-08-23: 22:00:00 via INTRAUTERINE

## 2016-08-23 MED ORDER — LACTATED RINGERS IV SOLN
INTRAVENOUS | Status: DC
  Administered 2016-08-23 (×3): via INTRAVENOUS

## 2016-08-23 MED ORDER — LIDOCAINE HCL (PF) 1 % IJ SOLN
30.0000 mL | INTRAMUSCULAR | Status: DC | PRN
  Filled 2016-08-23: qty 30

## 2016-08-23 MED ORDER — MISOPROSTOL 25 MCG QUARTER TABLET
25.0000 ug | ORAL_TABLET | ORAL | Status: DC | PRN
  Administered 2016-08-23: 25 ug via VAGINAL
  Filled 2016-08-23: qty 0.25
  Filled 2016-08-23: qty 1

## 2016-08-23 MED ORDER — OXYTOCIN BOLUS FROM INFUSION: 500.0000 mL | Freq: Once | INTRAVENOUS | Status: DC

## 2016-08-23 MED ORDER — ACETAMINOPHEN 325 MG PO TABS: 650.0000 mg | ORAL_TABLET | ORAL | Status: DC | PRN

## 2016-08-23 NOTE — H&P (Signed)
LABOR AND DELIVERY ADMISSION HISTORY AND PHYSICAL NOTE  Morgan Hodges is a 30 y.o. female G2P1001 with IUP at [redacted]w[redacted]d by LMP and early [place] presenting for IOL for postdates.   She reports positive fetal movement. She denies leakage of fluid or vaginal bleeding.  Prenatal History/Complications:  None  Past Medical History: Past Medical History:  Diagnosis Date  . Contraceptive management 03/09/2015  . Lymph node disorder    cervical  . Pregnant 01/06/2016  . Supervision of normal pregnancy in first trimester 01/27/2016    Past Surgical History: Past Surgical History:  Procedure Laterality Date  . NO PAST SURGERIES      Obstetrical History: OB History    Gravida Para Term Preterm AB Living   2 1 1     1    SAB TAB Ectopic Multiple Live Births           1      Social History: Social History   Social History  . Marital status: Married    Spouse name: N/A  . Number of children: N/A  . Years of education: N/A   Social History Main Topics  . Smoking status: Never Smoker  . Smokeless tobacco: Never Used  . Alcohol use No  . Drug use: No  . Sexual activity: Yes    Birth control/ protection: None   Other Topics Concern  . Not on file   Social History Narrative  . No narrative on file    Family History: Family History  Problem Relation Age of Onset  . Cancer Mother     breast  . Cancer Maternal Grandmother     breast  . Stroke Paternal Grandfather   . Cancer Paternal Grandmother     stomach    Allergies: Allergies  Allergen Reactions  . Sulfa Antibiotics     Reaction: unknown    Prescriptions Prior to Admission  Medication Sig Dispense Refill Last Dose  . prenatal vitamin w/FE, FA (PRENATAL 1 + 1) 27-1 MG TABS tablet Take 1 tablet by mouth daily at 12 noon. 30 each 11 Taking     Review of Systems   All systems reviewed and negative except as stated in HPI  Blood pressure 122/69, pulse 84, temperature 97.9 F (36.6 C), temperature source Oral, last  menstrual period 11/10/2015, unknown if currently breastfeeding. General appearance: alert, cooperative, appears stated age and no distress Lungs: clear to auscultation bilaterally Heart: regular rate and rhythm Abdomen: soft, non-tender; bowel sounds normal Extremities: No calf swelling or tenderness Presentation: cephalic Fetal monitoring: 145, mod var, +accels, no decels Uterine activity: q23min     Prenatal labs: ABO, Rh: A/Positive/-- (03/24 1221) Antibody: Negative (07/24 0844) Rubella: !Error! RPR: Non Reactive (07/24 0844)  HBsAg: Negative (03/24 1221)  HIV: Non Reactive (07/24 0844)  GBS: Negative (09/20 1500)  2 hr Glucola: 70/126/114 (normal) Genetic screening:  Neg Anatomy US: Normal, female  Prenatal Transfer Tool  Maternal Diabetes: No Genetic Screening: Normal Maternal Ultrasounds/Referrals: Normal Fetal Ultrasounds or other Referrals:  None Maternal Substance Abuse:  No Significant Maternal Medications:  None Significant Maternal Lab Results: None  No results found for this or any previous visit (from the past 24 hour(s)).  Patient Active Problem List   Diagnosis Date Noted  . Supervision of normal pregnancy 01/27/2016    Assessment: Morgan Hodges is a 30 y.o. G2P1001 at [redacted]w[redacted]d here for IOL for postdates  #Labor: IOL 2/2 postdates, FB + cytotec #Pain: Epidural on request, IV pain meds  prn before #FWB: Cat I #ID:  GBS Neg #MOF: Breast #MOC: POPs #Circ:  Yes   Jen MowElizabeth Eve Rey, DO 08/23/2016, 7:51 AM

## 2016-08-23 NOTE — Anesthesia Preprocedure Evaluation (Signed)
Anesthesia Evaluation  Patient identified by MRN, date of birth, ID band Patient awake    Reviewed: Allergy & Precautions, Patient's Chart, lab work & pertinent test results  Airway Mallampati: I  TM Distance: >3 FB Neck ROM: Full    Dental  (+) Teeth Intact, Dental Advisory Given   Pulmonary neg pulmonary ROS,    breath sounds clear to auscultation       Cardiovascular negative cardio ROS   Rhythm:Regular Rate:Normal     Neuro/Psych negative neurological ROS  negative psych ROS   GI/Hepatic negative GI ROS, Neg liver ROS,   Endo/Other  negative endocrine ROS  Renal/GU negative Renal ROS  negative genitourinary   Musculoskeletal negative musculoskeletal ROS (+)   Abdominal   Peds negative pediatric ROS (+)  Hematology negative hematology ROS (+)   Anesthesia Other Findings   Reproductive/Obstetrics negative OB ROS                             Lab Results  Component Value Date   WBC 11.8 (H) 08/23/2016   HGB 13.5 08/23/2016   HCT 38.9 08/23/2016   MCV 90.7 08/23/2016   PLT 199 08/23/2016   No results found for: INR, PROTIME   Anesthesia Physical Anesthesia Plan  ASA: II  Anesthesia Plan: Epidural   Post-op Pain Management:    Induction:   Airway Management Planned:   Additional Equipment:   Intra-op Plan:   Post-operative Plan:   Informed Consent: I have reviewed the patients History and Physical, chart, labs and discussed the procedure including the risks, benefits and alternatives for the proposed anesthesia with the patient or authorized representative who has indicated his/her understanding and acceptance.     Plan Discussed with:   Anesthesia Plan Comments:         Anesthesia Quick Evaluation

## 2016-08-23 NOTE — Anesthesia Pain Management Evaluation Note (Signed)
  [facility] Pain Management Visit Note  Patient: Morgan CloudMindy S Albritton, 30 y.o., female  "Hello I am a member of the anesthesia team at [facility]. We have an anesthesia team available at all times to provide care throughout the hospital, including epidural management and anesthesia for C-section. I don't know your plan for the delivery whether it a natural birth, water birth, IV sedation, nitrous supplementation, doula or epidural, but we want to meet your pain goals."   1.Was your pain managed to your expectations on prior hospitalizations?   No   2.What is your expectation for pain management during this hospitalization?     Epidural  3.How can we help you reach that goal? Epidural did not work well with first pregnancy. She is optimistic it will be better this time.  Record the patient's initial score and the patient's pain goal.   Pain: 1  Pain Goal: 7 [facility] wants you to be able to say your pain was always managed very well.  Nichols Corter 08/23/2016

## 2016-08-23 NOTE — Progress Notes (Signed)
AROM w/ light mec fluid.  FHR 150, Cat 1.  Ctx mild but q 1-2 minutes (after 1 cytotec)  Had a Foley from around 0900 until 1700. Will check cx again in a few hours, if not changing will start pitocin.

## 2016-08-23 NOTE — Progress Notes (Signed)
FHR had some prolonged variable decels which did not respond to IVF bolus, position changes so pitocin was DC'd.  Moderate variability throughout. IUPC placed and amnioinfusion started. Cx 8/90/-2.  Now FHR 145, moderate variability, occ mild variable decel only.  MVUs down to ~ 100.  Will hopefully restart pitocin soon if FHR remains stable.

## 2016-08-23 NOTE — Anesthesia Procedure Notes (Signed)
Epidural Patient location during procedure: OB Start time: 08/23/2016 7:29 PM End time: 08/23/2016 7:37 PM  Staffing Anesthesiologist: Shona SimpsonHOLLIS, Morgan Hodges D Performed: anesthesiologist   Preanesthetic Checklist Completed: patient identified, site marked, surgical consent, pre-op evaluation, timeout performed, IV checked, risks and benefits discussed and monitors and equipment checked  Epidural Patient position: sitting Prep: ChloraPrep Patient monitoring: heart rate, continuous pulse ox and blood pressure Approach: midline Location: L3-L4 Injection technique: LOR saline  Needle:  Needle type: Tuohy  Needle gauge: 17 G Needle length: 9 cm Catheter type: closed end flexible Catheter size: 20 Guage Test dose: negative and 1.5% lidocaine  Assessment Events: blood not aspirated, injection not painful, no injection resistance and no paresthesia  Additional Notes LOR @ 4.5  Patient identified. Risks/Benefits/Options discussed with patient including but not limited to bleeding, infection, nerve damage, paralysis, failed block, incomplete pain control, headache, blood pressure changes, nausea, vomiting, reactions to medications, itching and postpartum back pain. Confirmed with bedside nurse the patient's most recent platelet count. Confirmed with patient that they are not currently taking any anticoagulation, have any bleeding history or any family history of bleeding disorders. Patient expressed understanding and wished to proceed. All questions were answered. Sterile technique was used throughout the entire procedure. Please see nursing notes for vital signs. Test dose was given through epidural catheter and negative prior to continuing to dose epidural or start infusion. Warning signs of high block given to the patient including shortness of breath, tingling/numbness in hands, complete motor block, or any concerning symptoms with instructions to call for help. Patient was given instructions on  fall risk and not to get out of bed. All questions and concerns addressed with instructions to call with any issues or inadequate analgesia.    Reason for block:procedure for pain

## 2016-08-24 ENCOUNTER — Encounter (HOSPITAL_COMMUNITY): Payer: Self-pay

## 2016-08-24 DIAGNOSIS — Z3A41 41 weeks gestation of pregnancy: Secondary | ICD-10-CM

## 2016-08-24 DIAGNOSIS — O480 Post-term pregnancy: Secondary | ICD-10-CM

## 2016-08-24 DIAGNOSIS — Z823 Family history of stroke: Secondary | ICD-10-CM

## 2016-08-24 MED ORDER — SENNOSIDES-DOCUSATE SODIUM 8.6-50 MG PO TABS
2.0000 | ORAL_TABLET | ORAL | Status: DC
  Administered 2016-08-25: 2 via ORAL
  Filled 2016-08-24 (×2): qty 2

## 2016-08-24 MED ORDER — ONDANSETRON HCL 4 MG PO TABS: 4.0000 mg | ORAL_TABLET | ORAL | Status: DC | PRN

## 2016-08-24 MED ORDER — BENZOCAINE-MENTHOL 20-0.5 % EX AERO
1.0000 | INHALATION_SPRAY | CUTANEOUS | Status: DC | PRN
Start: 2016-08-24 — End: 2016-08-26
  Administered 2016-08-24: 1 via TOPICAL
  Filled 2016-08-24: qty 56

## 2016-08-24 MED ORDER — COCONUT OIL OIL: 1.0000 "application " | TOPICAL_OIL | Status: DC | PRN

## 2016-08-24 MED ORDER — TETANUS-DIPHTH-ACELL PERTUSSIS 5-2.5-18.5 LF-MCG/0.5 IM SUSP: 0.5000 mL | Freq: Once | INTRAMUSCULAR | Status: DC

## 2016-08-24 MED ORDER — DIPHENHYDRAMINE HCL 25 MG PO CAPS: 25.0000 mg | ORAL_CAPSULE | Freq: Four times a day (QID) | ORAL | Status: DC | PRN

## 2016-08-24 MED ORDER — WITCH HAZEL-GLYCERIN EX PADS: 1.0000 "application " | MEDICATED_PAD | CUTANEOUS | Status: DC | PRN

## 2016-08-24 MED ORDER — DIBUCAINE 1 % RE OINT: 1.0000 "application " | TOPICAL_OINTMENT | RECTAL | Status: DC | PRN

## 2016-08-24 MED ORDER — IBUPROFEN 600 MG PO TABS
600.0000 mg | ORAL_TABLET | Freq: Four times a day (QID) | ORAL | Status: DC
  Administered 2016-08-24 – 2016-08-26 (×9): 600 mg via ORAL
  Filled 2016-08-24 (×9): qty 1

## 2016-08-24 MED ORDER — SIMETHICONE 80 MG PO CHEW: 80.0000 mg | CHEWABLE_TABLET | ORAL | Status: DC | PRN

## 2016-08-24 MED ORDER — ZOLPIDEM TARTRATE 5 MG PO TABS: 5.0000 mg | ORAL_TABLET | Freq: Every evening | ORAL | Status: DC | PRN

## 2016-08-24 MED ORDER — ONDANSETRON HCL 4 MG/2ML IJ SOLN: 4.0000 mg | INTRAMUSCULAR | Status: DC | PRN

## 2016-08-24 MED ORDER — ACETAMINOPHEN 325 MG PO TABS
650.0000 mg | ORAL_TABLET | ORAL | Status: DC | PRN
Start: 2016-08-24 — End: 2016-08-26
  Administered 2016-08-24 (×2): 650 mg via ORAL
  Filled 2016-08-24 (×2): qty 2

## 2016-08-24 MED ORDER — PRENATAL MULTIVITAMIN CH
1.0000 | ORAL_TABLET | Freq: Every day | ORAL | Status: DC
  Administered 2016-08-24 – 2016-08-26 (×2): 1 via ORAL
  Filled 2016-08-24 (×2): qty 1

## 2016-08-24 NOTE — Anesthesia Postprocedure Evaluation (Signed)
Anesthesia Post Note  Patient: Lonzo CloudMindy S Simerly  Procedure(s) Performed: * No procedures listed *  Patient location during evaluation: Mother Baby Anesthesia Type: Epidural Level of consciousness: awake and alert Pain management: pain level controlled Vital Signs Assessment: post-procedure vital signs reviewed and stable Respiratory status: spontaneous breathing and nonlabored ventilation Cardiovascular status: stable Postop Assessment: no headache, no backache, patient able to bend at knees, epidural receding, no signs of nausea or vomiting and adequate PO intake Anesthetic complications: no     Last Vitals:  Vitals:   08/24/16 0930 08/24/16 1030  BP: 111/66 111/65  Pulse: 90 97  Resp: 20 20  Temp: 37.3 C 36.9 C    Last Pain:  Vitals:   08/24/16 1030  TempSrc: Oral  PainSc: 0-No pain   Pain Goal: Patients Stated Pain Goal: 3 (08/24/16 1030)               Milbern Doescher Hristova

## 2016-08-24 NOTE — Anesthesia Rounding Note (Signed)
  CRNA Epidural Rounding Note  Patient: Morgan Hodges, 30 y.o., female  Patient's current pain level: Pain Score: 0-No pain (08/23/16 2301)  Agreed upon pain management level: 7  Epidural intervention: Unable to assess - physician examing patient  Comments: Ready to push  [facility],Arne Schlender 08/24/2016

## 2016-08-24 NOTE — Lactation Note (Signed)
This note was copied from a baby's chart. Lactation Consultation Note  Patient Name: Boy Maia BreslowMindy Paskett FAOZH'YToday's Date: 08/24/2016 Reason for consult: Initial assessment (per mom last attempted at 1:15 for 5 mins , and then another 5 mins , encouraged to page with feeding cues ) Baby is 6 hours old and has been to the breast for attempts so far. Per mom has been doing STS when the baby has been sleepy.  LC encouraged mom to call when baby  is showing feeding cues so Latch assessment can be done.  Mom in the middle of eating lunch , dad holding baby and baby sleeping. Mother informed of post-discharge support and given phone number to the lactation department, including services for phone call assistance; out-patient  appointments; and breastfeeding support group. List of other breastfeeding resources in the community given in the handout. Encouraged mother to call  for problems or concerns related to breastfeeding. Maternal Data Does the patient have breastfeeding experience prior to this delivery?: Yes  Feeding per mom  Feeding Type: Breast Fed Length of feed: 5 min  LATCH Score/Interventions                Intervention(s): Breastfeeding basics reviewed     Lactation Tools Discussed/Used     Consult Status Consult Status: Follow-up Date: 08/24/16 Follow-up type: In-patient    Matilde SprangMargaret Ann Aldon Hengst 08/24/2016, 2:02 PM

## 2016-08-24 NOTE — Progress Notes (Signed)
Prolonged decel in the 70's for 5 minutes after pt turned to her side.  Cx C/C/ and vtx now much lower at 0 station.  FHR now 150's, avg variability and no decels. Pitocin now off again.  Hopefully, decel d/t rapid descent.  Will labor down and resume pitocin if FHR allows.

## 2016-08-24 NOTE — Lactation Note (Addendum)
This note was copied from a baby's chart. Lactation Consultation Note  Patient Name: Morgan Hodges ZOXWR'UToday's Date: 08/24/2016 Reason for consult: Follow-up assessment  Baby latched with assist in side lying position on the left breast , after several attempts  Latched with depth and fed 14 mins with multiple swallows, increased with breast compressions.  Baby released on his own and the nipple was well rounded.  Switched to the other breast side side lying and the baby didn't seem interested.  Showed mom how to hand express , and spoon fed 1 ml of EBM , and baby spoon fed well.  LC encouraged STS, and LC reassured mom the baby had a  Good latch with assistance and to  Hear so many swallows so early on was very encouraging.  Mom is an experienced breast feeding mother of a girl that is now 31 years old.  Per mom had to use a NS with her 1st baby . LC reassured mom a NS isn't needed with every baby, and  Her baby has latched at this feeding and multiple swallows noted.  Also if she is having challenges latching the Baby to call for assistance [facility] to assist. Mother informed of post-discharge support and given phone number to the lactation department, including services for phone  call assistance; out-patient appointments; and breastfeeding support group. List of other breastfeeding resources in the community  given in the handout. Encouraged mother to call for problems or concerns related to breastfeeding.    Maternal Data Has patient been taught Hand Expression?: Yes Does the patient have breastfeeding experience prior to this delivery?: Yes  Feeding Feeding Type: Breast Milk Length of feed: 14 min  LATCH Score/Interventions Latch: Repeated attempts needed to sustain latch, nipple held in mouth throughout feeding, stimulation needed to elicit sucking reflex. (right breast / side lying ) Intervention(s): Adjust position;Assist with latch;Breast  massage;Breast compression  Audible Swallowing: None  Type of Nipple: Everted at rest and after stimulation  Comfort (Breast/Nipple): Soft / non-tender     Hold (Positioning): Full assist, staff holds infant at breast Intervention(s): Breastfeeding basics reviewed;Support Pillows;Position options;Skin to skin  LATCH Score: 5  Lactation Tools Discussed/Used WIC Program: No   Consult Status Consult Status: Follow-up Date: 08/25/16 Follow-up type: In-patient    Matilde SprangMargaret Ann Osiah Haring 08/24/2016, 5:41 PM

## 2016-08-25 NOTE — Progress Notes (Signed)
POSTPARTUM PROGRESS NOTE  Post Partum Day 01 Subjective:  Morgan Hodges is a 30 y.o. [id number] 3872w1d s/p SVD @ 0700.  No acute events overnight.  Pt denies problems with ambulating, voiding or po intake.  She denies nausea or vomiting.  Pain is well controlled.  She has had flatus. She has not had bowel movement.  Lochia Small.   Objective: Blood pressure (!) 99/57, pulse 74, temperature 97.8 F (36.6 C), temperature source Oral, resp. rate 18, height 5\' 3"  (1.6 m), weight 150 lb (68 kg), last menstrual period 11/10/2015, SpO2 95 %, unknown if currently breastfeeding.  Physical Exam:  General: alert, cooperative and no distress Lochia:normal flow Heart: RRR  Abdomen: soft, nontender,  Uterine Fundus: firm, below the level of the umbilicus DVT Evaluation: No calf swelling or tenderness   Recent Labs  08/23/16 0800  HGB 13.5  HCT 38.9    Assessment/Plan:  ASSESSMENT: Morgan Hodges is a 30 y.o. [id number] 4072w1d s/p SVD @ 0700  Plan for discharge tomorrow, Breastfeeding, Lactation consult, Circumcision prior to discharge and Contraception Mini pill  Consent has already been signed   LOS: 2 days   Deniece ReeJosue D Santos, MD 08/25/2016, 7:42 AM    OB FELLOW POSTPARTUM PROGRESS NOTE ATTESTATION  I have seen and examined this patient and agree with above documentation in the resident's note.   Jen MowElizabeth Mumaw, DO

## 2016-08-25 NOTE — Lactation Note (Signed)
This note was copied from a baby's chart. Lactation Consultation Note  Patient Name: Morgan Maia BreslowMindy Stradford NFAOZ'HToday's Date: 08/25/2016 Reason for consult: Follow-up assessment Baby at 34 hr of life. Mom requesting latch help. She denies breast or nipple pain. She is having a hard time getting baby to maintain sucking so parents have been pumping and offering expressed milk with curved tip syringe. Mom desires to "mostly" latch baby and pump for times away from baby. Upon entry mom was holding baby in cradle position leaned over baby letting him just grab the tip of the nipple. Baby would suck 2-3 times then come off. Had mom lean back, hold baby in cross cradle, and use the "C" hold on the breast. Baby was able to latch comfortably and maintained long bursts of sucking. He was on both breast 10 minutes. Mom asked if she could have a 21 mm flange because she thinks too much of her breast is being pulled in the 24 mm flange. Gave mom 27 mm flange to try. It is lactation's opinion from visual assessment that a 21 mm flange might be too small. Since baby was latched well mom declined to try the flange at this visit. She will call back when she pumps for sizing. Discussed baby behavior, feeding frequency, pumping, syringe feeding amounts per guidelines, baby belly size, voids, wt loss, breast changes, and nipple care. Mom is aware of lactation services and support group. She will call as needed.     Maternal Data    Feeding Feeding Type: Breast Fed Length of feed: 20 min  LATCH Score/Interventions Latch: Repeated attempts needed to sustain latch, nipple held in mouth throughout feeding, stimulation needed to elicit sucking reflex. Intervention(s): Adjust position;Assist with latch;Breast massage;Breast compression  Audible Swallowing: Spontaneous and intermittent Intervention(s): Hand expression;Skin to skin  Type of Nipple: Everted at rest and after stimulation  Comfort (Breast/Nipple): Soft /  non-tender     Hold (Positioning): Assistance needed to correctly position infant at breast and maintain latch. Intervention(s): Support Pillows;Position options  LATCH Score: 8  Lactation Tools Discussed/Used     Consult Status Consult Status: Follow-up Date: 08/26/16 Follow-up type: In-patient    Rulon Eisenmengerlizabeth E Myrel Rappleye 08/25/2016, 5:45 PM

## 2016-08-26 MED ORDER — SENNOSIDES-DOCUSATE SODIUM 8.6-50 MG PO TABS: 2.0000 | ORAL_TABLET | ORAL | 1 refills | Status: DC

## 2016-08-26 NOTE — Discharge Instructions (Signed)

## 2016-08-26 NOTE — Lactation Note (Signed)
This note was copied from a baby's chart. Lactation Consultation Note: Mother states that it is getting easier to get infant latched on. She states that while doing skin to skin this morning infant self latched. Mother states that she feels much better and doesn't think she needs L'C to observed feeding. Mother was advised to be aware of S/S o mastitis . Mother advised in treatment plan to prevent engorgement. Mother advised to feed infant 8-12 times in 24 hours. Advised mother to keep accurate account of outpt. [facility] any [facility]. Mother is aware of available LC services.                            Patient Name: Boy Maia BreslowMindy Shults ZOXWR'UToday's Date: 08/26/2016     Maternal Data    Feeding Feeding Type: Breast Milk  LATCH Score/Interventions Latch: Repeated attempts needed to sustain latch, nipple held in mouth throughout feeding, stimulation needed to elicit sucking reflex. Intervention(s): Assist with latch;Adjust position  Audible Swallowing: A few with stimulation Intervention(s): Hand expression Intervention(s): Hand expression  Type of Nipple: Everted at rest and after stimulation  Comfort (Breast/Nipple): Soft / non-tender     Hold (Positioning): Assistance needed to correctly position infant at breast and maintain latch.  LATCH Score: 7  Lactation Tools Discussed/Used     Consult Status      Michel BickersKendrick, Audreanna Torrisi McCoy 08/26/2016, 9:31 AM

## 2016-08-26 NOTE — Discharge Summary (Signed)
OB Discharge Summary     Patient Name: Morgan Hodges DOB: 06-11-1986 MRN: [id number]  Date of admission: 08/23/2016 Delivering MD: Willodean Rosenthal   Date of discharge: 08/26/2016  Admitting diagnosis: INDUCTION Intrauterine pregnancy: [redacted]w[redacted]d     Secondary diagnosis:  Active Problems:   Post term pregnancy at [redacted] weeks gestation  Additional problems: None     Discharge diagnosis: Term Pregnancy Delivered                                                                                                Post partum procedures:None  Augmentation: Pitocin and Cytotec  Complications: None  Hospital course:  Onset of Labor With Vaginal Delivery     30 y.o. yo [id number] at [redacted]w[redacted]d was admitted for IOL on 08/23/2016. Patient had an uncomplicated labor course as follows:  Membrane Rupture Time/Date: 7:09 PM ,08/23/2016   Intrapartum Procedures: Episiotomy: None [1]                                         Lacerations:  None [1]  Patient had a delivery of a Viable infant. 08/24/2016  Information for the patient's newborn:  Desirea, Mizrahi [[id number]]  Delivery Method: Vaginal, Spontaneous Delivery (Filed from Delivery Summary)    Pateint had an uncomplicated postpartum course.  She is ambulating, tolerating a regular diet, passing flatus, and urinating well. Patient is discharged home in stable condition on 08/26/16.    Physical exam  Vitals:   08/24/16 2245 08/25/16 0609 08/25/16 1812 08/26/16 0551  BP: 110/61 (!) 99/57 (!) 104/59 98/63  Pulse: 75 74 76 70  Resp: 18 18 16 17   Temp: 97.6 F (36.4 C) 97.8 F (36.6 C) 98 F (36.7 C) 97.5 F (36.4 C)  TempSrc: Oral Oral Oral Oral  SpO2:   99%   Weight:      Height:       General: alert Lochia: appropriate Uterine Fundus: firm Incision: N/A DVT Evaluation: No evidence of DVT seen on physical exam. Labs: Lab Results  Component Value Date   WBC 11.8 (H) 08/23/2016   HGB 13.5 08/23/2016   HCT 38.9 08/23/2016   MCV 90.7 08/23/2016   PLT 199 08/23/2016   No flowsheet data found.  Discharge instruction: per After Visit Summary and "Baby and Me Booklet".  After visit meds:    Medication List    STOP taking these medications   prenatal vitamin w/FE, FA 27-1 MG Tabs tablet     TAKE these medications   senna-docusate 8.6-50 MG tablet Commonly known as:  Senokot-S Take 2 tablets by mouth daily. Start taking on:  08/27/2016       Diet: routine diet  Activity: Advance as tolerated. Pelvic rest for 6 weeks.   Outpatient follow up:6 weeks Follow up Appt: Future Appointments Date Time Provider Department Center  09/20/2016 2:45 PM Lazaro Arms, MD [facility]   Follow up Visit:No Follow-up on file.  Postpartum contraception: Combination OCPs  Newborn Data: Live born  female  Birth Weight: 8 lb 12.2 oz (3975 g) APGAR: 8, 9  Baby Feeding: Breast Disposition:home with mother   08/26/2016 Deniece ReeJosue D Santos, MD

## 2016-08-26 NOTE — Progress Notes (Signed)
Discharge eduction complete. Discharge instructions and follow up appointment discussed. Patient verbalized understanding.

## 2016-08-29 ENCOUNTER — Encounter (HOSPITAL_COMMUNITY): Payer: Self-pay | Admitting: *Deleted

## 2016-08-29 ENCOUNTER — Inpatient Hospital Stay (HOSPITAL_COMMUNITY)
Admission: EM | Admit: 2016-08-29 | Discharge: 2016-09-01 | DRG: 776 | Disposition: A | Discharge status: Home / Self Care | Payer: Commercial Managed Care - HMO | Attending: Obstetrics & Gynecology | Admitting: Obstetrics & Gynecology

## 2016-08-29 ENCOUNTER — Emergency Department (HOSPITAL_COMMUNITY): Payer: Commercial Managed Care - HMO

## 2016-08-29 ENCOUNTER — Telehealth (HOSPITAL_COMMUNITY): Payer: Self-pay | Admitting: Lactation Services

## 2016-08-29 DIAGNOSIS — O1495 Unspecified pre-eclampsia, complicating the puerperium: Secondary | ICD-10-CM | POA: Diagnosis present

## 2016-08-29 DIAGNOSIS — R339 Retention of urine, unspecified: Secondary | ICD-10-CM | POA: Diagnosis present

## 2016-08-29 DIAGNOSIS — R079 Chest pain, unspecified: Secondary | ICD-10-CM | POA: Diagnosis not present

## 2016-08-29 LAB — CBC WITH DIFFERENTIAL/PLATELET
Basophils Absolute: 0.1 10*3/uL (ref 0.0–0.1)
Basophils Relative: 1 %
EOS ABS: 0.3 10*3/uL (ref 0.0–0.7)
EOS PCT: 2 %
HCT: 39.1 % (ref 36.0–46.0)
HEMOGLOBIN: 13.2 g/dL (ref 12.0–15.0)
LYMPHS ABS: 2.8 10*3/uL (ref 0.7–4.0)
Lymphocytes Relative: 23 %
MCH: 31.6 pg (ref 26.0–34.0)
MCHC: 33.8 g/dL (ref 30.0–36.0)
MCV: 93.5 fL (ref 78.0–100.0)
MONO ABS: 0.7 10*3/uL (ref 0.1–1.0)
MONOS PCT: 6 %
NEUTROS PCT: 68 %
Neutro Abs: 8.4 10*3/uL — ABNORMAL HIGH (ref 1.7–7.7)
Platelets: 270 10*3/uL (ref 150–400)
RBC: 4.18 MIL/uL (ref 3.87–5.11)
RDW: 13.3 % (ref 11.5–15.5)
WBC: 12.2 10*3/uL — ABNORMAL HIGH (ref 4.0–10.5)

## 2016-08-29 LAB — HEPATIC FUNCTION PANEL
ALK PHOS: 110 U/L (ref 38–126)
ALT: 115 U/L — ABNORMAL HIGH (ref 14–54)
AST: 53 U/L — ABNORMAL HIGH (ref 15–41)
Albumin: 2.5 g/dL — ABNORMAL LOW (ref 3.5–5.0)
BILIRUBIN INDIRECT: 0.4 mg/dL (ref 0.3–0.9)
BILIRUBIN TOTAL: 0.5 mg/dL (ref 0.3–1.2)
Bilirubin, Direct: 0.1 mg/dL (ref 0.1–0.5)
TOTAL PROTEIN: 6 g/dL — AB (ref 6.5–8.1)

## 2016-08-29 LAB — URINE MICROSCOPIC-ADD ON

## 2016-08-29 LAB — URINALYSIS, ROUTINE W REFLEX MICROSCOPIC
BILIRUBIN URINE: NEGATIVE
Glucose, UA: NEGATIVE mg/dL
Ketones, ur: NEGATIVE mg/dL
Nitrite: NEGATIVE
Protein, ur: NEGATIVE mg/dL
SPECIFIC GRAVITY, URINE: 1.025 (ref 1.005–1.030)
pH: 6 (ref 5.0–8.0)

## 2016-08-29 LAB — BASIC METABOLIC PANEL
Anion gap: 7 (ref 5–15)
BUN: 21 mg/dL — AB (ref 6–20)
CHLORIDE: 112 mmol/L — AB (ref 101–111)
CO2: 20 mmol/L — AB (ref 22–32)
CREATININE: 0.96 mg/dL (ref 0.44–1.00)
Calcium: 8.1 mg/dL — ABNORMAL LOW (ref 8.9–10.3)
GFR calc Af Amer: >60 mL/min (ref >60)
GFR calc non Af Amer: >60 mL/min (ref >60)
GLUCOSE: 87 mg/dL (ref 65–99)
Potassium: 4 mmol/L (ref 3.5–5.1)
Sodium: 139 mmol/L (ref 135–145)

## 2016-08-29 NOTE — Telephone Encounter (Signed)
Mom called left message and I called her back. Morgan Hodges is now 35 days old. [facility] P2 Reports she does not feel the left down like she did with her first baby. States she does not feel empty after nursing. Is doing some pumping but feels milk is slow to come out. Reviewed engorgement prevention and treatment. Encouraged ice packs and massage before and during breast feeding. Reports nipples are tender. Suggested pumping prior to nursing if breasts are too full baby may have trouble getting a deep latch. No further questions at present. To call prn

## 2016-08-29 NOTE — ED Provider Notes (Signed)
[facility]-EM[facility]Y DEPT Provider Note   CSN: [id number] Arrival date & time: 08/29/16  2206 By signing my name below, I, Morgan Hodges, attest that this documentation has been prepared under the direction and in the presence of [name], MD . Electronically Signed: Levon Hodges, Scribe. 08/29/2016. 10:39 PM.   History   Chief Complaint Chief Complaint  Patient presents with  . Chest Pain   HPI Morgan Hodges is a 30 y.o. female who presents to the Emergency Department complaining of moderate, unchanging chest pain onset today. She describes her pain as pressure. Per pt, her pulse PTA was 48 bpm. Pt states that her blood pressure while in triage was 152/97 which is high for her. No alleviating or modifying factors noted.  Pt also notes some edema to her bilateral ankles which she states she had during her pregnancy. She gave birth 5 days ago; pt had a vaginal delivery with no complications and was in the hospital for 24 hours. Pt has never experienced these symptoms before. She denies any hx of GERD or family hx of heart problems. Pt denies any cough, SOB, dizziness, or dysuria.  The history is provided by the patient. No language interpreter was used.   Past Medical History:  Diagnosis Date  . Contraceptive management 03/09/2015  . Lymph node disorder    cervical  . Pregnant 01/06/2016  . Supervision of normal pregnancy in first trimester 01/27/2016    Patient Active Problem List   Diagnosis Date Noted  . Post term pregnancy at [redacted] weeks gestation 08/23/2016  . Supervision of normal pregnancy 01/27/2016    Past Surgical History:  Procedure Laterality Date  . NO PAST SURGERIES      OB History    Gravida Para Term Preterm AB Living   2 2 2     2    SAB TAB Ectopic Multiple Live Births         0 2     Home Medications    Prior to Admission medications   Medication Sig Start Date End Date Taking? Authorizing Provider  ibuprofen (ADVIL,MOTRIN) 200 MG tablet Take 200 mg by  mouth every 6 (six) hours as needed.   Yes Historical Provider, MD  Prenatal Vit-Fe Fumarate-FA (PRENATAL MULTIVITAMIN) TABS tablet Take 1 tablet by mouth daily at 12 noon.   Yes Historical Provider, MD  senna-docusate (SENOKOT-S) 8.6-50 MG tablet Take 2 tablets by mouth daily. 08/27/16   [name], MD    Family History Family History  Problem Relation Age of Onset  . Cancer Mother     breast  . Cancer Maternal Grandmother     breast  . Stroke Paternal Grandfather   . Cancer Paternal Grandmother     stomach    Social History Social History  Substance Use Topics  . Smoking status: Never Smoker  . Smokeless tobacco: Never Used  . Alcohol use No   Allergies   Sulfa antibiotics   Review of Systems Review of Systems  Respiratory: Negative for cough and shortness of breath.   Cardiovascular: Positive for chest pain and leg swelling.  Genitourinary: Negative for dysuria.  Neurological: Negative for dizziness.   Physical Exam Updated Vital Signs BP 169/97 (BP Location: Left Arm)   Pulse 77   Temp 98.1 F (36.7 C) (Oral)   Resp 18   Ht 5\' 3"  (1.6 m)   Wt 145 lb 4.8 oz (65.9 kg)   LMP 11/10/2015 Comment: Pt delivered 08/24/2016  SpO2 96%  Breastfeeding? Yes   BMI 25.74 kg/m   Physical Exam  Constitutional: She is oriented to person, place, and time. She appears well-developed and well-nourished. No distress.  HENT:  Head: Normocephalic and atraumatic.  Eyes: Conjunctivae are normal.  Cardiovascular: Normal rate.   Pulmonary/Chest: Effort normal.  Abdominal: She exhibits no distension.  Musculoskeletal: She exhibits edema ( 1+ edema ).  Neurological: She is alert and oriented to person, place, and time.  Skin: Skin is warm and dry.  Psychiatric: She has a normal mood and affect.  Nursing note and vitals reviewed.  ED Treatments / Results  DIAGNOSTIC STUDIES:  Oxygen Saturation is 98% on RA, normal by my interpretation.    COORDINATION OF CARE:  10:38  PM Discussed treatment plan with pt at bedside and pt agreed to plan.   Labs (all labs ordered are listed, but only abnormal results are displayed) Labs Reviewed  BASIC METABOLIC PANEL - Abnormal; Notable for the following:       Result Value   Chloride 112 (*)    CO2 20 (*)    BUN 21 (*)    Calcium 8.1 (*)    All other components within normal limits  CBC WITH DIFFERENTIAL/PLATELET - Abnormal; Notable for the following:    WBC 12.2 (*)    Neutro Abs 8.4 (*)    All other components within normal limits  URINALYSIS, ROUTINE W REFLEX MICROSCOPIC (NOT AT [facility]) - Abnormal; Notable for the following:    Color, Urine AMBER (*)    APPearance HAZY (*)    Hgb urine dipstick LARGE (*)    Leukocytes, UA SMALL (*)    All other components within normal limits  HEPATIC FUNCTION PANEL - Abnormal; Notable for the following:    Total Protein 6.0 (*)    Albumin 2.5 (*)    AST 53 (*)    ALT 115 (*)    All other components within normal limits  URINE MICROSCOPIC-ADD ON - Abnormal; Notable for the following:    Squamous Epithelial / LPF 6-30 (*)    Bacteria, UA MANY (*)    All other components within normal limits    EKG  EKG Interpretation None      Radiology Dg Chest 2 View  Result Date: 08/29/2016 CLINICAL DATA:  Intermittent chest pain and dyspnea EXAM: CHEST  2 VIEW COMPARISON:  None. FINDINGS: The heart size and mediastinal contours are within normal limits. Mild increase in interstitial prominence may be bronchitic in etiology. No alveolar consolidation, effusion or pneumothorax. No pulmonary edema. The visualized skeletal structures are unremarkable. IMPRESSION: Mild increase in interstitial prominence which may reflect bronchitic change. Electronically Signed   By: Tollie Ethavid  Kwon M.D.   On: 08/29/2016 23:49    Procedures Procedures (including critical care time)  Medications Ordered in ED Medications - No data to display   Initial Impression / Assessment and Plan / ED Course    I have reviewed the triage vital signs and the nursing notes.  Pertinent labs & imaging results that were available during my care of the patient were reviewed by me and considered in my medical decision making (see chart for details).  Clinical Course  Value Comment By Time  Total Protein: (!) 6.0 Slightly low Mancel BaleElliott Dayvian Blixt, MD 10/25 2352  Albumin: (!) 2.5 Low Mancel BaleElliott Yohannes Waibel, MD 10/25 2352  AST: (!) 53 Slightly high Mancel BaleElliott Erielle Gawronski, MD 10/25 2352  ALT: (!) 115 High Mancel BaleElliott Nicolo Tomko, MD 10/25 2352  WBC: (!) 12.2 I Mancel BaleElliott Shakeita Vandevander,  MD 10/25 2352  Chloride: (!) 112 High [name], MD 10/25 2352  CO2: (!) 20 Slightly low [name], MD 10/25 2353  WBC, UA: TOO NUMEROUS TO COUNT Consistent with UTI [name], MD 10/25 2353  Bacteria, UA: (!) MANY Consistent with UTI [name], MD 10/25 2353  Total Bilirubin: 0.5 (Reviewed) [name], MD 10/25 2354  DG Chest 2 View No evidence for pneumonia [name], MD 10/25 2354  BP: 169/97 High  [name], MD 10/25 2356   Case discussed with her obstetrician, Dr. Despina Hidden; who requests that she come to [facility] to be treated for eclampsia. He states that she can come by private vehicle. [name], MD 10/26 0004    Medications - No data to display  Patient Vitals for the past 24 hrs:  BP Temp Temp src Pulse Resp SpO2 Height Weight  08/29/16 2307 169/97 98.1 F (36.7 C) Oral - - - - -  08/29/16 2255 - - - 77 18 96 % - -  08/29/16 2214 - - - - - - 5\' 3"  (1.6 m) 145 lb 4.8 oz (65.9 kg)  08/29/16 2213 152/97 97.9 F (36.6 C) Oral (!) 58 20 98 % - -    11:55 PM Reevaluation with update and discussion. After initial assessment and treatment, an updated evaluation reveals No change in clinical status. Chabeli Barsamian L    Final Clinical Impressions(s) / ED Diagnoses   Final diagnoses:  Eclampsia   Evaluation is consistent with eclampsia. Persistent blood pressure elevation, with mild fluid overload. No respiratory distress.  Normal oxygen saturation on room air.  Nursing Notes Reviewed/ Care Coordinated Applicable Imaging Reviewed Interpretation of Laboratory Data incorporated into ED treatment  Plan- discharge to go by private vehicle to the maternity admission unit at [facility]. After arrival there, she will be admitted for treatment with magnesium infusions.  New Prescriptions New Prescriptions   No medications on file  I personally performed the services described in this documentation, which was scribed in my presence. The recorded information has been reviewed and is accurate.     [name], MD 08/30/16 [phone]

## 2016-08-30 ENCOUNTER — Encounter (HOSPITAL_COMMUNITY): Payer: Self-pay

## 2016-08-30 DIAGNOSIS — O1495 Unspecified pre-eclampsia, complicating the puerperium: Secondary | ICD-10-CM | POA: Diagnosis present

## 2016-08-30 DIAGNOSIS — R079 Chest pain, unspecified: Secondary | ICD-10-CM | POA: Diagnosis present

## 2016-08-30 DIAGNOSIS — R339 Retention of urine, unspecified: Secondary | ICD-10-CM | POA: Diagnosis present

## 2016-08-30 MED ORDER — MAGNESIUM SULFATE 50 % IJ SOLN
2.0000 g/h | INTRAVENOUS | Status: AC
  Administered 2016-08-30 (×2): 2 g/h via INTRAVENOUS
  Filled 2016-08-30 (×2): qty 80

## 2016-08-30 MED ORDER — ZOLPIDEM TARTRATE 5 MG PO TABS: 5.0000 mg | ORAL_TABLET | Freq: Every evening | ORAL | Status: DC | PRN

## 2016-08-30 MED ORDER — LABETALOL HCL 5 MG/ML IV SOLN
INTRAVENOUS | Status: AC
  Filled 2016-08-30: qty 4

## 2016-08-30 MED ORDER — HYDRALAZINE HCL 20 MG/ML IJ SOLN: 10.0000 mg | Freq: Once | INTRAMUSCULAR | Status: DC | PRN

## 2016-08-30 MED ORDER — LABETALOL HCL 5 MG/ML IV SOLN
20.0000 mg | INTRAVENOUS | Status: DC | PRN
  Administered 2016-08-30: 20 mg via INTRAVENOUS

## 2016-08-30 MED ORDER — ACETAMINOPHEN 325 MG PO TABS
650.0000 mg | ORAL_TABLET | Freq: Once | ORAL | Status: AC
  Administered 2016-08-30: 650 mg via ORAL
  Filled 2016-08-30: qty 2

## 2016-08-30 MED ORDER — ACETAMINOPHEN 325 MG PO TABS
650.0000 mg | ORAL_TABLET | Freq: Four times a day (QID) | ORAL | Status: DC | PRN
  Administered 2016-08-30: 650 mg via ORAL
  Filled 2016-08-30: qty 2

## 2016-08-30 MED ORDER — LACTATED RINGERS IV SOLN
INTRAVENOUS | Status: DC
  Administered 2016-08-30 – 2016-08-31 (×4): via INTRAVENOUS

## 2016-08-30 MED ORDER — MAGNESIUM SULFATE BOLUS VIA INFUSION
4.0000 g | Freq: Once | INTRAVENOUS | Status: DC
  Filled 2016-08-30: qty 500

## 2016-08-30 MED ORDER — ENALAPRIL MALEATE 10 MG PO TABS
10.0000 mg | ORAL_TABLET | Freq: Every day | ORAL | Status: DC
  Administered 2016-08-30 – 2016-09-01 (×3): 10 mg via ORAL
  Filled 2016-08-30 (×5): qty 1

## 2016-08-30 NOTE — Progress Notes (Signed)
Dr Alysia PennaErvin notified of cath result and order was given to insert a Foley and leave in until the AM 0600.Patient made aware that a Foley will be placed to drain her bladder until the AM.Peri care done and a #14 Fr Foley catheter inserted and placed to straight drain.Patient tolerated well and is aware that it will be removed in the AM.Catheter is draining clear yellow urine.

## 2016-08-30 NOTE — H&P (Signed)
Morgan Hodges is a 30 y.o. G2P2002 who is PP s/p on 08/24/16. She went to [facility] with chest pain and headache. She was found to have elevated blood pressure and elevated liver enzymes. She reports that her vaginal bleeding has been "normal". No other concerns. She did not have elevated blood pressure during the pregnancy. She did not have magnesium during labor.    Hypertension  This is a new problem. The current episode started today. The problem is unchanged. Associated symptoms include chest pain and headaches. Pertinent negatives include no blurred vision. There are no associated agents to hypertension. Risk factors: Pregnancy  Past treatments include nothing.     Past Medical History:  Diagnosis Date  . Contraceptive management 03/09/2015  . Lymph node disorder    cervical  . Pregnant 01/06/2016  . Supervision of normal pregnancy in first trimester 01/27/2016    Past Surgical History:  Procedure Laterality Date  . NO PAST SURGERIES      Family History  Problem Relation Age of Onset  . Cancer Mother     breast  . Cancer Maternal Grandmother     breast  . Stroke Paternal Grandfather   . Cancer Paternal Grandmother     stomach    Social History  Substance Use Topics  . Smoking status: Never Smoker  . Smokeless tobacco: Never Used  . Alcohol use No    Allergies:  Allergies  Allergen Reactions  . Sulfa Antibiotics     Childhood reaction, thinks it was a rash    Prescriptions Prior to Admission  Medication Sig Dispense Refill Last Dose  . ibuprofen (ADVIL,MOTRIN) 200 MG tablet Take 200 mg by mouth every 6 (six) hours as needed.   08/29/2016 at Unknown time  . Prenatal Vit-Fe Fumarate-FA (PRENATAL MULTIVITAMIN) TABS tablet Take 1 tablet by mouth daily at 12 noon.   08/29/2016 at Unknown time  . senna-docusate (SENOKOT-S) 8.6-50 MG tablet Take 2 tablets by mouth daily. 60 tablet 1     Review of Systems  Eyes: Negative for blurred vision and double  vision.  Cardiovascular: Positive for chest pain.  Gastrointestinal: Negative for abdominal pain.  Neurological: Positive for headaches.   Physical Exam   Blood pressure 167/97, pulse 77, temperature 98.1 F (36.7 C), temperature source Oral, resp. rate 20, height 5\' 3"  (1.6 m), weight 145 lb 4.8 oz (65.9 kg), last menstrual period 11/10/2015, SpO2 99 %, currently breastfeeding.  Physical Exam  Nursing note and vitals reviewed. Constitutional: She is oriented to person, place, and time. She appears well-developed and well-nourished. No distress.  HENT:  Head: Normocephalic.  Cardiovascular: Normal rate.   Respiratory: Effort normal.  GI: Soft. There is no tenderness. There is no rebound.  Neurological: She is alert and oriented to person, place, and time.  Skin: Skin is warm and dry.  Psychiatric: She has a normal mood and affect.    Results for orders placed or performed during the hospital encounter of 08/29/16 (from the past 24 hour(s))  Urinalysis, Routine w reflex microscopic     Status: Abnormal   Collection Time: 08/29/16 10:35 PM  Result Value Ref Range   Color, Urine AMBER (A) YELLOW   APPearance HAZY (A) CLEAR   Specific Gravity, Urine 1.025 1.005 - 1.030   pH 6.0 5.0 - 8.0   Glucose, UA NEGATIVE NEGATIVE mg/dL   Hgb urine dipstick LARGE (A) NEGATIVE   Bilirubin Urine NEGATIVE NEGATIVE   Ketones, ur NEGATIVE NEGATIVE mg/dL  Protein, ur NEGATIVE NEGATIVE mg/dL   Nitrite NEGATIVE NEGATIVE   Leukocytes, UA SMALL (A) NEGATIVE  Urine microscopic-add on     Status: Abnormal   Collection Time: 08/29/16 10:35 PM  Result Value Ref Range   Squamous Epithelial / LPF 6-30 (A) NONE SEEN   WBC, UA TOO NUMEROUS TO COUNT 0 - 5 WBC/hpf   RBC / HPF TOO NUMEROUS TO COUNT 0 - 5 RBC/hpf   Bacteria, UA MANY (A) NONE SEEN   Urine-Other MUCOUS PRESENT   Basic metabolic panel     Status: Abnormal   Collection Time: 08/29/16 10:40 PM  Result Value Ref Range   Sodium 139 135 - 145  mmol/L   Potassium 4.0 3.5 - 5.1 mmol/L   Chloride 112 (H) 101 - 111 mmol/L   CO2 20 (L) 22 - 32 mmol/L   Glucose, Bld 87 65 - 99 mg/dL   BUN 21 (H) 6 - 20 mg/dL   Creatinine, Ser 1.610.96 0.44 - 1.00 mg/dL   Calcium 8.1 (L) 8.9 - 10.3 mg/dL   GFR calc non Af Amer >60 >60 mL/min   GFR calc Af Amer >60 >60 mL/min   Anion gap 7 5 - 15  CBC with Differential     Status: Abnormal   Collection Time: 08/29/16 10:40 PM  Result Value Ref Range   WBC 12.2 (H) 4.0 - 10.5 K/uL   RBC 4.18 3.87 - 5.11 MIL/uL   Hemoglobin 13.2 12.0 - 15.0 g/dL   HCT 09.639.1 04.536.0 - 40.946.0 %   MCV 93.5 78.0 - 100.0 fL   MCH 31.6 26.0 - 34.0 pg   MCHC 33.8 30.0 - 36.0 g/dL   RDW 81.113.3 91.411.5 - 78.215.5 %   Platelets 270 150 - 400 K/uL   Neutrophils Relative % 68 %   Neutro Abs 8.4 (H) 1.7 - 7.7 K/uL   Lymphocytes Relative 23 %   Lymphs Abs 2.8 0.7 - 4.0 K/uL   Monocytes Relative 6 %   Monocytes Absolute 0.7 0.1 - 1.0 K/uL   Eosinophils Relative 2 %   Eosinophils Absolute 0.3 0.0 - 0.7 K/uL   Basophils Relative 1 %   Basophils Absolute 0.1 0.0 - 0.1 K/uL  Hepatic function panel     Status: Abnormal   Collection Time: 08/29/16 10:40 PM  Result Value Ref Range   Total Protein 6.0 (L) 6.5 - 8.1 g/dL   Albumin 2.5 (L) 3.5 - 5.0 g/dL   AST 53 (H) 15 - 41 U/L   ALT 115 (H) 14 - 54 U/L   Alkaline Phosphatase 110 38 - 126 U/L   Total Bilirubin 0.5 0.3 - 1.2 mg/dL   Bilirubin, Direct 0.1 0.1 - 0.5 mg/dL   Indirect Bilirubin 0.4 0.3 - 0.9 mg/dL    MAU Course  Procedures  MDM D/W Dr. Despina HiddenEure will admit for magnesium and blood pressure management.   Assessment and Plan  Postpartum Pre-eclampsia Admit Magnesium Labetalol protocol PRN   Tawnya CrookHogan, Nicholle Falzon Donovan 08/30/2016, 1:03 AM

## 2016-08-30 NOTE — Discharge Instructions (Signed)
Go to the [facility]U at [facility], now, to be admitted for Magnesium infusion treatment.

## 2016-08-30 NOTE — MAU Note (Signed)
[facility] s/p svd on 10/20. Presented there tonight with complaint of chest pain and B/p's were noted to be elevated.

## 2016-08-30 NOTE — MAU Provider Note (Signed)
History     CSN: [id number]  Arrival date and time: 08/29/16 2206   None     Chief Complaint  Patient presents with  . Chest Pain   Morgan Hodges is a 30 y.o. G2P2002 who is PP s/p on 08/24/16. [facility] with chest pain and headache. She was found to have elevated blood pressure and elevated liver enzymes. She reports that her vaginal bleeding has been "normal". No other concerns. She did not have elevated blood pressure during the pregnancy. She did not have magnesium during labor.    Hypertension  This is a new problem. The current episode started today. The problem is unchanged. Associated symptoms include chest pain and headaches. Pertinent negatives include no blurred vision. There are no associated agents to hypertension. Risk factors: Pregnancy  Past treatments include nothing.     Past Medical History:  Diagnosis Date  . Contraceptive management 03/09/2015  . Lymph node disorder    cervical  . Pregnant 01/06/2016  . Supervision of normal pregnancy in first trimester 01/27/2016    Past Surgical History:  Procedure Laterality Date  . NO PAST SURGERIES      Family History  Problem Relation Age of Onset  . Cancer Mother     breast  . Cancer Maternal Grandmother     breast  . Stroke Paternal Grandfather   . Cancer Paternal Grandmother     stomach    Social History  Substance Use Topics  . Smoking status: Never Smoker  . Smokeless tobacco: Never Used  . Alcohol use No    Allergies:  Allergies  Allergen Reactions  . Sulfa Antibiotics     Childhood reaction, thinks it was a rash    Prescriptions Prior to Admission  Medication Sig Dispense Refill Last Dose  . ibuprofen (ADVIL,MOTRIN) 200 MG tablet Take 200 mg by mouth every 6 (six) hours as needed.   08/29/2016 at Unknown time  . Prenatal Vit-Fe Fumarate-FA (PRENATAL MULTIVITAMIN) TABS tablet Take 1 tablet by mouth daily at 12 noon.   08/29/2016 at Unknown time  . senna-docusate  (SENOKOT-S) 8.6-50 MG tablet Take 2 tablets by mouth daily. 60 tablet 1     Review of Systems  Eyes: Negative for blurred vision and double vision.  Cardiovascular: Positive for chest pain.  Gastrointestinal: Negative for abdominal pain.  Neurological: Positive for headaches.   Physical Exam   Blood pressure 167/97, pulse 77, temperature 98.1 F (36.7 C), temperature source Oral, resp. rate 20, height 5\' 3"  (1.6 m), weight 145 lb 4.8 oz (65.9 kg), last menstrual period 11/10/2015, SpO2 99 %, currently breastfeeding.  Physical Exam  Nursing note and vitals reviewed. Constitutional: She is oriented to person, place, and time. She appears well-developed and well-nourished. No distress.  HENT:  Head: Normocephalic.  Cardiovascular: Normal rate.   Respiratory: Effort normal.  GI: Soft. There is no tenderness. There is no rebound.  Neurological: She is alert and oriented to person, place, and time.  Skin: Skin is warm and dry.  Psychiatric: She has a normal mood and affect.    Results for orders placed or performed during the hospital encounter of 08/29/16 (from the past 24 hour(s))  Urinalysis, Routine w reflex microscopic     Status: Abnormal   Collection Time: 08/29/16 10:35 PM  Result Value Ref Range   Color, Urine AMBER (A) YELLOW   APPearance HAZY (A) CLEAR   Specific Gravity, Urine 1.025 1.005 - 1.030   pH 6.0  5.0 - 8.0   Glucose, UA NEGATIVE NEGATIVE mg/dL   Hgb urine dipstick LARGE (A) NEGATIVE   Bilirubin Urine NEGATIVE NEGATIVE   Ketones, ur NEGATIVE NEGATIVE mg/dL   Protein, ur NEGATIVE NEGATIVE mg/dL   Nitrite NEGATIVE NEGATIVE   Leukocytes, UA SMALL (A) NEGATIVE  Urine microscopic-add on     Status: Abnormal   Collection Time: 08/29/16 10:35 PM  Result Value Ref Range   Squamous Epithelial / LPF 6-30 (A) NONE SEEN   WBC, UA TOO NUMEROUS TO COUNT 0 - 5 WBC/hpf   RBC / HPF TOO NUMEROUS TO COUNT 0 - 5 RBC/hpf   Bacteria, UA MANY (A) NONE SEEN   Urine-Other MUCOUS  PRESENT   Basic metabolic panel     Status: Abnormal   Collection Time: 08/29/16 10:40 PM  Result Value Ref Range   Sodium 139 135 - 145 mmol/L   Potassium 4.0 3.5 - 5.1 mmol/L   Chloride 112 (H) 101 - 111 mmol/L   CO2 20 (L) 22 - 32 mmol/L   Glucose, Bld 87 65 - 99 mg/dL   BUN 21 (H) 6 - 20 mg/dL   Creatinine, Ser 4.09 0.44 - 1.00 mg/dL   Calcium 8.1 (L) 8.9 - 10.3 mg/dL   GFR calc non Af Amer >60 >60 mL/min   GFR calc Af Amer >60 >60 mL/min   Anion gap 7 5 - 15  CBC with Differential     Status: Abnormal   Collection Time: 08/29/16 10:40 PM  Result Value Ref Range   WBC 12.2 (H) 4.0 - 10.5 K/uL   RBC 4.18 3.87 - 5.11 MIL/uL   Hemoglobin 13.2 12.0 - 15.0 g/dL   HCT 81.1 91.4 - 78.2 %   MCV 93.5 78.0 - 100.0 fL   MCH 31.6 26.0 - 34.0 pg   MCHC 33.8 30.0 - 36.0 g/dL   RDW 95.6 21.3 - 08.6 %   Platelets 270 150 - 400 K/uL   Neutrophils Relative % 68 %   Neutro Abs 8.4 (H) 1.7 - 7.7 K/uL   Lymphocytes Relative 23 %   Lymphs Abs 2.8 0.7 - 4.0 K/uL   Monocytes Relative 6 %   Monocytes Absolute 0.7 0.1 - 1.0 K/uL   Eosinophils Relative 2 %   Eosinophils Absolute 0.3 0.0 - 0.7 K/uL   Basophils Relative 1 %   Basophils Absolute 0.1 0.0 - 0.1 K/uL  Hepatic function panel     Status: Abnormal   Collection Time: 08/29/16 10:40 PM  Result Value Ref Range   Total Protein 6.0 (L) 6.5 - 8.1 g/dL   Albumin 2.5 (L) 3.5 - 5.0 g/dL   AST 53 (H) 15 - 41 U/L   ALT 115 (H) 14 - 54 U/L   Alkaline Phosphatase 110 38 - 126 U/L   Total Bilirubin 0.5 0.3 - 1.2 mg/dL   Bilirubin, Direct 0.1 0.1 - 0.5 mg/dL   Indirect Bilirubin 0.4 0.3 - 0.9 mg/dL    MAU Course  Procedures  MDM D/W Dr. Despina Hidden will admit for magnesium and blood pressure management.   Assessment and Plan  Postpartum Pre-eclampsia Admit Magnesium Labetalol protocol PRN   Tawnya Crook 08/30/2016, 1:03 AM

## 2016-08-30 NOTE — Progress Notes (Signed)
Patient c/o voiding but feels like she still needs to urinate.Patient has voided twice once for 300 and then 250.500 cc of clear yellow urine already in the measuring hat prior to these two voids.Bladder scanned for 999 cc post void residual.This scan was repeated x 2 on one machine and double checked on a different scanner and amount in bladder remained at 999 cc.Dr Alysia PennaErvin notified and order given to I&O cath patient,Patient cathed and 1900 cc of clear yellow urine was drained after clamping tubing to let bladder rest after 600 cc was removed.

## 2016-08-31 NOTE — Progress Notes (Addendum)
Post Partum Day 7 Subjective: Patient is doing well without complaints. She denies HA, visual changes, RUQ/epigastric pain, nausea or emesis. She has been unable to void since catheter was removed this morning  Objective: Blood pressure 121/73, pulse 83, temperature 98.8 F (37.1 C), temperature source Oral, resp. rate 18, height 5\' 3"  (1.6 m), weight 59.1 kg (130 lb 4 oz), last menstrual period 11/10/2015, SpO2 97 %, currently breastfeeding.  Physical Exam:  General: alert, cooperative and no distress Lochia: appropriate Uterine Fundus: firm DVT Evaluation: No evidence of DVT seen on physical exam. Negative Homan's sign.   Recent Labs  08/29/16 2240  HGB 13.2  HCT 39.1    Assessment/Plan: 30 yo readmitted with pp preeclampsia s/p magnesium sulfate now with urinary retention - BP normal on Enalapril - Discussed voiding trial this evening, vs in the morning or discharge with leg bag. Patient desires voiding trial in am knowing that if she fails she will be discharged with a leg bag - Continue current care   LOS: 1 day   Morgan Hodges 08/31/2016, 3:02 PM

## 2016-08-31 NOTE — Lactation Note (Signed)
Lactation Consultation Note  Patient Name: Morgan CloudMindy S Cockerill NGEXB'MToday's Date: 08/31/2016  Visit with mom who was readmitted for treatment of elevated blood pressures.  I looked up her meds in [facility] and all meds are a L2.  Mom is currently pumping with her personal DEBP.  She has an abundant milk supply.  Nipples both with healing cracks.  24 mm flange appears too tight so mom given 27 mm.  Comfort gels given with instructions.  Baby is not with mom now.  Encouraged to pump 8-12 times in 24 hours.   Maternal Data    Feeding    LATCH Score/Interventions                      Lactation Tools Discussed/Used     Consult Status      Huston FoleyMOULDEN, Kamry Faraci S 08/31/2016, 12:25 PM

## 2016-09-01 MED ORDER — ENALAPRIL MALEATE 10 MG PO TABS: 10.0000 mg | ORAL_TABLET | Freq: Every day | ORAL | 2 refills | Status: DC

## 2016-09-01 MED ORDER — ACETAMINOPHEN 325 MG PO TABS: 650.0000 mg | ORAL_TABLET | Freq: Four times a day (QID) | ORAL | 0 refills | Status: AC | PRN

## 2016-09-01 MED ORDER — DOCUSATE SODIUM 100 MG PO CAPS
100.0000 mg | ORAL_CAPSULE | Freq: Two times a day (BID) | ORAL | Status: DC
  Administered 2016-09-01 (×2): 100 mg via ORAL
  Filled 2016-09-01 (×2): qty 1

## 2016-09-01 NOTE — Progress Notes (Signed)
Spoke with resident on call, notified him that pt has voided a total of 350cc of urine since foley was d/c at 0630 this am, however, pt does not feel like she is emptying her bladder completely. Bladder scan shows >694cc in bladder. Orders received to replace foley and pt will be d/c with foley. Sheryn BisonGordon, Sixto Bowdish Warner

## 2016-09-01 NOTE — Discharge Summary (Signed)
Antenatal Physician Discharge Summary  Patient ID: Morgan Hodges MRN: [id number] DOB/AGE: 31-10-1986 31 y.o.  Admit date: 08/29/2016 Discharge date: 09/01/2016  Admission Diagnoses: Post partum pre-eclampsia   Discharge Diagnoses: Post partum Pre-eclampsia                                         Acute urinary Retention   Significant Diagnostic Studies:  Results for orders placed or performed during the hospital encounter of 08/29/16 (from the past 168 hour(s))  Urinalysis, Routine w reflex microscopic   Collection Time: 08/29/16 10:35 PM  Result Value Ref Range   Color, Urine AMBER (A) YELLOW   APPearance HAZY (A) CLEAR   Specific Gravity, Urine 1.025 1.005 - 1.030   pH 6.0 5.0 - 8.0   Glucose, UA NEGATIVE NEGATIVE mg/dL   Hgb urine dipstick LARGE (A) NEGATIVE   Bilirubin Urine NEGATIVE NEGATIVE   Ketones, ur NEGATIVE NEGATIVE mg/dL   Protein, ur NEGATIVE NEGATIVE mg/dL   Nitrite NEGATIVE NEGATIVE   Leukocytes, UA SMALL (A) NEGATIVE  Urine microscopic-add on   Collection Time: 08/29/16 10:35 PM  Result Value Ref Range   Squamous Epithelial / LPF 6-30 (A) NONE SEEN   WBC, UA TOO NUMEROUS TO COUNT 0 - 5 WBC/hpf   RBC / HPF TOO NUMEROUS TO COUNT 0 - 5 RBC/hpf   Bacteria, UA MANY (A) NONE SEEN   Urine-Other MUCOUS PRESENT   Basic metabolic panel   Collection Time: 08/29/16 10:40 PM  Result Value Ref Range   Sodium 139 135 - 145 mmol/L   Potassium 4.0 3.5 - 5.1 mmol/L   Chloride 112 (H) 101 - 111 mmol/L   CO2 20 (L) 22 - 32 mmol/L   Glucose, Bld 87 65 - 99 mg/dL   BUN 21 (H) 6 - 20 mg/dL   Creatinine, Ser 4.780.96 0.44 - 1.00 mg/dL   Calcium 8.1 (L) 8.9 - 10.3 mg/dL   GFR calc non Af Amer >60 >60 mL/min   GFR calc Af Amer >60 >60 mL/min   Anion gap 7 5 - 15  CBC with Differential   Collection Time: 08/29/16 10:40 PM  Result Value Ref Range   WBC 12.2 (H) 4.0 - 10.5 K/uL   RBC 4.18 3.87 - 5.11 MIL/uL   Hemoglobin 13.2 12.0 - 15.0 g/dL   HCT 29.539.1 62.136.0 - 30.846.0 %   MCV  93.5 78.0 - 100.0 fL   MCH 31.6 26.0 - 34.0 pg   MCHC 33.8 30.0 - 36.0 g/dL   RDW 65.713.3 84.611.5 - 96.215.5 %   Platelets 270 150 - 400 K/uL   Neutrophils Relative % 68 %   Neutro Abs 8.4 (H) 1.7 - 7.7 K/uL   Lymphocytes Relative 23 %   Lymphs Abs 2.8 0.7 - 4.0 K/uL   Monocytes Relative 6 %   Monocytes Absolute 0.7 0.1 - 1.0 K/uL   Eosinophils Relative 2 %   Eosinophils Absolute 0.3 0.0 - 0.7 K/uL   Basophils Relative 1 %   Basophils Absolute 0.1 0.0 - 0.1 K/uL  Hepatic function panel   Collection Time: 08/29/16 10:40 PM  Result Value Ref Range   Total Protein 6.0 (L) 6.5 - 8.1 g/dL   Albumin 2.5 (L) 3.5 - 5.0 g/dL   AST 53 (H) 15 - 41 U/L   ALT 115 (H) 14 - 54 U/L   Alkaline Phosphatase 110 38 -  126 U/L   Total Bilirubin 0.5 0.3 - 1.2 mg/dL   Bilirubin, Direct 0.1 0.1 - 0.5 mg/dL   Indirect Bilirubin 0.4 0.3 - 0.9 mg/dL      Hospital Course:  This is a 30 y.o. [id number] who was 6 day post partum on admission. She was found to have elevated BP and was placed on the labetalol protocol and Magnesium. She remained on magnesium for 24 hours. She was found to have acute urinary retention and failed trial of voiding x2. She was sent home on enalapril 10mg  and a foley catheter with leg bad.   Discharge Exam: BP 119/71   Pulse 72   Temp 97.6 F (36.4 C) (Oral)   Resp 18   Ht 5\' 3"  (1.6 m)   Wt 132 lb 8 oz (60.1 kg)   LMP 11/10/2015 Comment: Pt delivered 08/24/2016  SpO2 98%   Breastfeeding? Yes   BMI 23.47 kg/m   Physical Exam  Constitutional: She appears well-developed and well-nourished.  HENT:  Head: Normocephalic and atraumatic.  Cardiovascular: Normal rate and intact distal pulses.   Pulmonary/Chest: Effort normal. No respiratory distress.  Abdominal: Soft. Bowel sounds are normal. She exhibits no distension. There is no tenderness.  Musculoskeletal: Normal range of motion. She exhibits no edema.  Skin: Skin is warm and dry.  Psychiatric: She has a normal mood and affect.  Her behavior is normal.    Discharge Condition: good  Disposition: 01-Home or Self Care     Medication List    TAKE these medications   acetaminophen 325 MG tablet Commonly known as:  TYLENOL Take 2 tablets (650 mg total) by mouth every 6 (six) hours as needed for mild pain or moderate pain.   enalapril 10 MG tablet Commonly known as:  VASOTEC Take 1 tablet (10 mg total) by mouth daily. Start taking on:  09/02/2016   ibuprofen 200 MG tablet Commonly known as:  ADVIL,MOTRIN Take 200 mg by mouth every 6 (six) hours as needed.   prenatal multivitamin Tabs tablet Take 1 tablet by mouth daily at 12 noon.   senna-docusate 8.6-50 MG tablet Commonly known as:  Senokot-S Take 2 tablets by mouth daily.      Follow-up Information    [facility] Follow up in 4 day(s).   Specialty:  Obstetrics and Gynecology Why:  for BP and foley removal Contact information: [address] [place] [phone]          Signed: Ernestina Pennaicholas Schenk M.D. 09/01/2016, 11:26 AM

## 2016-09-01 NOTE — Consult Note (Signed)
Lactation note on this mom, a readmission . Baby is with mom and dad , mom is breastfeeding, but denies any questions/concerns or need for lactation at this time.

## 2016-09-01 NOTE — Progress Notes (Signed)
Pt verbalizes understanding of d/c instructions, medications, follow up appts, when to seek medical attention and belongings policy. Pt has no questions at this time. Medications were called in to her pharmacy. Pts husband is at her bedside and helping with baby. Husband met [place] at front entrance and will be driving her home. I ambulated w/pt to the main entrance to her vehicle. Marry Guan

## 2016-09-05 ENCOUNTER — Ambulatory Visit (INDEPENDENT_AMBULATORY_CARE_PROVIDER_SITE_OTHER): Payer: Commercial Managed Care - HMO | Admitting: Obstetrics and Gynecology

## 2016-09-05 ENCOUNTER — Encounter: Payer: Self-pay | Admitting: Obstetrics and Gynecology

## 2016-09-05 VITALS — BP 100/62 | HR 80 | Ht 63.0 in | Wt 124.8 lb

## 2016-09-05 DIAGNOSIS — Z466 Encounter for fitting and adjustment of urinary device: Secondary | ICD-10-CM

## 2016-09-05 DIAGNOSIS — R338 Other retention of urine: Secondary | ICD-10-CM

## 2016-09-05 NOTE — Progress Notes (Addendum)
   [facility] Visit  09/05/16            Patient name: Morgan CloudMindy S Clendenin MRN [id number]  Date of birth: November 17, 1985  CC & HPI:  Morgan Hodges is a 30 y.o. female presenting today for follow up. Patient is post partum and delivered on 08/24/16. She was able to void upon discharge but had little output so she was put on a foley catheter. She presents today to have catheter removed.   ROS:  ROS +foley catheter Otherwise negative  Pertinent History Reviewed:   Reviewed: Significant for  Medical         Past Medical History:  Diagnosis Date  . Contraceptive management 03/09/2015  . Lymph node disorder    cervical  . Pregnant 01/06/2016  . Supervision of normal pregnancy in first trimester 01/27/2016                              Surgical Hx:    Past Surgical History:  Procedure Laterality Date  . NO PAST SURGERIES     Medications: Reviewed & Updated - see associated section                       Current Outpatient Prescriptions:  .  acetaminophen (TYLENOL) 325 MG tablet, Take 2 tablets (650 mg total) by mouth every 6 (six) hours as needed for mild pain or moderate pain., Disp: 30 tablet, Rfl: 0 .  enalapril (VASOTEC) 10 MG tablet, Take 1 tablet (10 mg total) by mouth daily., Disp: 30 tablet, Rfl: 2 .  ibuprofen (ADVIL,MOTRIN) 200 MG tablet, Take 200 mg by mouth every 6 (six) hours as needed., Disp: , Rfl:  .  Prenatal Vit-Fe Fumarate-FA (PRENATAL MULTIVITAMIN) TABS tablet, Take 1 tablet by mouth daily at 12 noon., Disp: , Rfl:  .  senna-docusate (SENOKOT-S) 8.6-50 MG tablet, Take 2 tablets by mouth daily., Disp: 60 tablet, Rfl: 1   Social History: Reviewed -  reports that she has never smoked. She has never used smokeless tobacco.  Objective Findings:  Vitals: Blood pressure 100/62, pulse 80, height 5\' 3"  (1.6 m), weight 124 lb 12.8 oz (56.6 kg), last menstrual period 11/10/2015, currently breastfeeding.  Physical Examination: General appearance - alert, well appearing, and  in no distress Pelvic -  VULVA: normal appearing vulva with no masses, tenderness or lesions, no evidence or hematoma    Assessment & Plan:   A:  1. Catheter removal s/p postpartum urinary retension of 1900 cc immediately postop 2 no vulvar lesions susp[ected.  P:  1. Start Micronor 4 weeks post partum 2. Follow up 6 weeks     By signing my name below, I, Sonum Patel, attest that this documentation has been prepared under the direction and in the presence of Tilda BurrowJohn V Antwuan Eckley, MD. Electronically Signed: Sonum Patel, Neurosurgeoncribe. 09/05/16. 12:27 PM.  I personally performed the services described in this documentation, which was SCRIBED in my presence. The recorded information has been reviewed and considered accurate. It has been edited as necessary during review. Tilda BurrowFERGUSON,Ellora Varnum V, MD  AADDENDUM:  Pt notes recurrent urinary retention over night, and had to self cath at midnight. Will use Urecholine today tid x 10 mg/dose and if not relieved will see at 4 pm

## 2016-09-06 ENCOUNTER — Telehealth: Payer: Self-pay | Admitting: Obstetrics and Gynecology

## 2016-09-06 ENCOUNTER — Telehealth (HOSPITAL_COMMUNITY): Payer: Self-pay | Admitting: Lactation Services

## 2016-09-06 DIAGNOSIS — R338 Other retention of urine: Secondary | ICD-10-CM | POA: Insufficient documentation

## 2016-09-06 MED ORDER — BETHANECHOL CHLORIDE 10 MG PO TABS: 10.0000 mg | ORAL_TABLET | Freq: Three times a day (TID) | ORAL | 0 refills | Status: DC

## 2016-09-06 NOTE — Telephone Encounter (Signed)
Mom called, left message and I called her back. She was prescribed Urecholine for urinary retention and asking whether it is ok with breast feeding. Per Bobbye Mortonhomas Hale it is L4 and recommended to " use with great caution" Mom states she will use her pumped milk for now. To continue pumping to prevent engorgement. No further questions at present.

## 2016-09-06 NOTE — Telephone Encounter (Signed)
Per Dr.Ferguson Urecholine e-scribed to pharmacy, if no improvement by this afternoon. Pt to come in for a 4 pm appt. Pt verbalized understanding.

## 2016-09-06 NOTE — Patient Instructions (Signed)
Acute Urinary Retention, Female  Urinary retention means you are unable to pee completely or at all (empty your bladder).  HOME CARE  · Drink enough fluids to keep your pee (urine) clear or pale yellow.  · If you are sent home with a tube that drains the bladder (catheter), there will be a drainage bag attached to it. There are two types of bags. One is big that you can wear at night without having to empty it. One is smaller and needs to be emptied more often.    Keep the drainage bag emptied.    Keep the drainage bag lower than the tube.  · Only take medicine as told by your doctor.  GET HELP IF:  · You have a low-grade fever.  · You have spasms or you are leaking pee when you have spasms.  GET HELP RIGHT AWAY IF:   · You have chills or a fever.  · Your catheter stops draining pee.  · Your catheter falls out.  · You have increased bleeding that does not stop after you have rested and increased the amount of fluids you had been drinking.  MAKE SURE YOU:   · Understand these instructions.  · Will watch your condition.  · Will get help right away if you are not doing well or get worse.     This information is not intended to replace advice given to you by your health care provider. Make sure you discuss any questions you have with your health care provider.     Document Released: 04/09/2008 Document Revised: 03/08/2015 Document Reviewed: 04/02/2013  Elsevier Interactive Patient Education ©2016 Elsevier Inc.

## 2016-09-07 ENCOUNTER — Ambulatory Visit (INDEPENDENT_AMBULATORY_CARE_PROVIDER_SITE_OTHER): Payer: Commercial Managed Care - HMO | Admitting: Obstetrics and Gynecology

## 2016-09-07 VITALS — BP 100/62 | HR 66 | Ht 63.0 in | Wt 125.6 lb

## 2016-09-07 DIAGNOSIS — R339 Retention of urine, unspecified: Secondary | ICD-10-CM | POA: Diagnosis not present

## 2016-09-07 MED ORDER — NITROFURANTOIN MACROCRYSTAL 100 MG PO CAPS: 100.0000 mg | ORAL_CAPSULE | Freq: Every day | ORAL | 0 refills | Status: DC

## 2016-09-07 NOTE — Progress Notes (Signed)
[facility] Visit  09/07/2016           Patient name: Morgan CloudMindy S Bidinger MRN [id number]  Date of birth: [birth year]  CC & HPI:  Morgan Hodges is a 30 y.o. female presenting today for follow up. Pt is 2 weeks post partum. She reports continued urinary retention since giving birth. She has been self catheterizing. She last cathed this AM with moderate relief. She notes very little urination without the catheter. Pt has no other acute complaints or symptoms at this time.     ROS:  ROS Otherwise negative for acute change except as noted in the HPI.   Pertinent History Reviewed:   Reviewed:  Medical         Past Medical History:  Diagnosis Date  . Contraceptive management 03/09/2015  . Lymph node disorder    cervical  . Pregnant 01/06/2016  . Supervision of normal pregnancy in first trimester 01/27/2016                              Surgical Hx:    Past Surgical History:  Procedure Laterality Date  . NO PAST SURGERIES     Medications: Reviewed & Updated - see associated section                       Current Outpatient Prescriptions:  .  acetaminophen (TYLENOL) 325 MG tablet, Take 2 tablets (650 mg total) by mouth every 6 (six) hours as needed for mild pain or moderate pain., Disp: 30 tablet, Rfl: 0 .  bethanechol (URECHOLINE) 10 MG tablet, Take 1 tablet (10 mg total) by mouth 3 (three) times daily., Disp: 30 tablet, Rfl: 0 .  enalapril (VASOTEC) 10 MG tablet, Take 1 tablet (10 mg total) by mouth daily., Disp: 30 tablet, Rfl: 2 .  ibuprofen (ADVIL,MOTRIN) 200 MG tablet, Take 200 mg by mouth every 6 (six) hours as needed., Disp: , Rfl:  .  Prenatal Vit-Fe Fumarate-FA (PRENATAL MULTIVITAMIN) TABS tablet, Take 1 tablet by mouth daily at 12 noon., Disp: , Rfl:  .  senna-docusate (SENOKOT-S) 8.6-50 MG tablet, Take 2 tablets by mouth daily., Disp: 60 tablet, Rfl: 1   Social History: Reviewed -  reports that she has never smoked. She has never used smokeless tobacco.  Objective  Findings:  Vitals: Blood pressure 100/62, pulse 66, height 5\' 3"  (1.6 m), weight 125 lb 9.6 oz (57 kg), last menstrual period 11/10/2015, currently breastfeeding.  Physical Examination: General appearance - alert, well appearing, and in no distress Mental status - alert, oriented to person, place, and time Pelvic - examination not indicated U/s PVR done approx 100 cc.pt not able to void further but reassured by relatively small PVR.  Assessment & Plan:   A:  1. Postpartum urinary retention  2. Post void residual 100c   P:  1. Check PVR = approx 100 cc on u/s eval our office 2. Add macrodantin 100mg  x 2 weeks 3. Continue urecholine x 1 week  4. Continue self cath 1-2 times daily/PRN    By signing my name below, I, Freida Busmaniana Omoyeni, attest that this documentation has been prepared under the direction and in the presence of Tilda BurrowJohn V Coda Mathey, MD . Electronically Signed: Freida Busmaniana Omoyeni, Scribe. 09/07/2016. 12:23 PM. I personally performed the services described in this documentation, which was SCRIBED in my presence. The recorded information has been reviewed and considered accurate. It has  been edited as necessary during review. Tilda BurrowFERGUSON,Riven Mabile V, MD

## 2016-09-20 ENCOUNTER — Ambulatory Visit: Payer: Commercial Managed Care - HMO | Admitting: Obstetrics & Gynecology

## 2016-10-02 ENCOUNTER — Ambulatory Visit (INDEPENDENT_AMBULATORY_CARE_PROVIDER_SITE_OTHER): Payer: Commercial Managed Care - HMO | Admitting: Obstetrics & Gynecology

## 2016-10-02 ENCOUNTER — Encounter: Payer: Self-pay | Admitting: Obstetrics & Gynecology

## 2016-10-02 MED ORDER — NORETHINDRONE 0.35 MG PO TABS: 1.0000 | ORAL_TABLET | Freq: Every day | ORAL | 11 refills | Status: DC

## 2016-10-02 NOTE — Progress Notes (Signed)
Subjective:     Morgan Hodges Much is a 30 y.o. female who presents for a postpartum visit. She is 6 weeks postpartum following a spontaneous vaginal delivery. I have fully reviewed the prenatal and intrapartum course. The delivery was at unremarkable.  unfortunately she suffered postpartum severe pre eclampsia and urinary retention. gestational weeks. Outcome: spontaneous vaginal delivery. Anesthesia: epidural. Postpartum course has been as above. Baby'Hodges course has been normal. Baby is feeding by breast. Bleeding staining only. Bowel function is normal. Bladder function is normal now. Patient is not sexually active. Contraception method is none. Postpartum depression screening: negative.  The following portions of the patient'Hodges history were reviewed and updated as appropriate: allergies, current medications, past family history, past medical history, past social history, past surgical history and problem list.  Review of Systems Pertinent items are noted in HPI.   Objective:    Breastfeeding? Yes   General:  alert, cooperative and no distress   Breasts:  inspection negative, no nipple discharge or bleeding, no masses or nodularity palpable  Lungs:   Heart:    Abdomen: soft, non-tender; bowel sounds normal; no masses,  no organomegaly   Vulva:  normal  Vagina: not evaluated  Cervix:  no cervical motion tenderness and no lesions  Corpus: normal size, contour, position, consistency, mobility, non-tender  Adnexa:  normal adnexa  Rectal Exam: Not performed.        Assessment:     normal  postpartum exam. Pap smear not done at today'Hodges visit.   Plan:    1. Contraception: oral progesterone-only contraceptive 2.  3. Follow up in: 6 months or as needed.

## 2016-10-16 ENCOUNTER — Telehealth (HOSPITAL_COMMUNITY): Payer: Self-pay | Admitting: Lactation Services

## 2016-10-16 ENCOUNTER — Telehealth (HOSPITAL_COMMUNITY): Payer: Self-pay

## 2016-10-16 NOTE — Telephone Encounter (Signed)
Mom c/o plugged duct. Mom reports that she has had plugged ducts before with first child, a 31-year-old, but this time it is not seeming to clear up as quickly. Mom reports that she initially had a plugged duct on Saturday, 10/13/16. Mom states that she felt some relief with warm compresses, massage, nursing and pumping. Then on Sunday, mom had a 100.5 degree fever, which passed quickly. Mom had a 2 separate milk blebs which seemed to drain and are now resolved. However, mom concerned that she still has some fullness and soreness in the area of the plugged ducts. Mom states that she pumped earlier and saw strings of thick EBM, and since has been able to pump a greater volume of EBM.   Enc mom to continue to nurse often, changing the position of the baby to drain the breast well. Enc mom to post-pump and hand express in order to completely soften the breast. Discussed s/s of mastitis and yeast, and enc mom to call HCP if mom's condition does not continue to improve. Discussed with mom that a plugged duct should resolve in 48 hours. Mom believes she has had 2 separate plugged ducts, close together. Discussed reducing saturated fat in diet and lecithin to prevent future plugged ducts. Enc mom to call back as needed.

## 2016-10-16 NOTE — Telephone Encounter (Signed)
Question regarding plugged duct.  No Answer. Left message.

## 2016-10-17 ENCOUNTER — Encounter: Payer: Self-pay | Admitting: Advanced Practice Midwife

## 2016-10-17 ENCOUNTER — Telehealth: Payer: Self-pay | Admitting: Advanced Practice Midwife

## 2016-10-17 ENCOUNTER — Ambulatory Visit (INDEPENDENT_AMBULATORY_CARE_PROVIDER_SITE_OTHER): Payer: Commercial Managed Care - HMO | Admitting: Advanced Practice Midwife

## 2016-10-17 VITALS — BP 122/82 | HR 60 | Ht 63.0 in | Wt 122.0 lb

## 2016-10-17 DIAGNOSIS — N61 Mastitis without abscess: Secondary | ICD-10-CM

## 2016-10-17 MED ORDER — FLUCONAZOLE 150 MG PO TABS: ORAL_TABLET | ORAL | 2 refills | Status: DC

## 2016-10-17 MED ORDER — DICLOXACILLIN SODIUM 250 MG PO CAPS: 250.0000 mg | ORAL_CAPSULE | Freq: Four times a day (QID) | ORAL | 0 refills | Status: DC

## 2016-10-17 NOTE — Telephone Encounter (Signed)
Pt states she has been having breast pain since last week, she is breast feeding and thought at first she had a clogged milk duct.  She has been massaging her breast and using worm compresses, pain had improved some over the weekend but is very sore again now, breast has some redness but she is not sure if it is from where she has been massaging it.  It is painful for baby to nurse.  Advised pt to come in and be evaluated and call transferred to front staff for appointment to be made.

## 2016-10-18 NOTE — Progress Notes (Signed)
[facility] Visit  Patient name: Morgan Hodges MRN [id number]  Date of birth: 06-14-1986  CC & HPI:  Morgan Hodges is a 30 y.o. Caucasian female presenting today for left breast pain for over a week. She is breastfeeding and noticed a painful lump on upper left breast a week ago.  Has tried to massage it out, only went down a little bit.  Had a painful blister on nipple as well.  No fever  Pertinent History Reviewed:  Medical & Surgical Hx:   Past Medical History:  Diagnosis Date  . Contraceptive management 03/09/2015  . Lymph node disorder    cervical  . Preeclampsia in postpartum period   . Pregnant 01/06/2016  . Supervision of normal pregnancy in first trimester 01/27/2016   Past Surgical History:  Procedure Laterality Date  . NO PAST SURGERIES     Family History  Problem Relation Age of Onset  . Cancer Mother     breast  . Cancer Maternal Grandmother     breast  . Stroke Paternal Grandfather   . Cancer Paternal Grandmother     stomach    Current Outpatient Prescriptions:  .  acetaminophen (TYLENOL) 325 MG tablet, Take 2 tablets (650 mg total) by mouth every 6 (six) hours as needed for mild pain or moderate pain., Disp: 30 tablet, Rfl: 0 .  ibuprofen (ADVIL,MOTRIN) 200 MG tablet, Take 200 mg by mouth every 6 (six) hours as needed., Disp: , Rfl:  .  Prenatal Vit-Fe Fumarate-FA (PRENATAL MULTIVITAMIN) TABS tablet, Take 1 tablet by mouth daily at 12 noon., Disp: , Rfl:  .  dicloxacillin (DYNAPEN) 250 MG capsule, Take 1 capsule (250 mg total) by mouth 4 (four) times daily., Disp: 28 capsule, Rfl: 0 .  fluconazole (DIFLUCAN) 150 MG tablet, 1 po stat; repeat in 3 days, Disp: 2 tablet, Rfl: 2 .  norethindrone (MICRONOR,CAMILA,ERRIN) 0.35 MG tablet, Take 1 tablet (0.35 mg total) by mouth daily. Take 1 a day (Patient not taking: Reported on 10/17/2016), Disp: 1 Package, Rfl: 11 Social History: Reviewed -  reports that she has never smoked. She has never used smokeless  tobacco.  Review of Systems:   Constitutional: Negative for fever and chills Eyes: Negative for visual disturbances Respiratory: Negative for shortness of breath, dyspnea Cardiovascular: Negative for chest pain or palpitations  Gastrointestinal: Negative for vomiting, diarrhea and constipation; no abdominal pain Genitourinary: Negative for dysuria and urgency, vaginal irritation or itching Musculoskeletal: Negative for back pain, joint pain, myalgias  Neurological: Negative for dizziness and headaches    Objective Findings:    Physical Examination: General appearance - well appearing, and in no distress Mental status - alert, oriented to person, place, and time Chest:  Normal respiratory effort Breast:  Left upper breast w/firm 50 cent sized duct. Sl red.  Heart - normal rate and regular rhythm Abdomen:  Soft, nontender Musculoskeletal:  Normal range of motion without pain Extremities:  No edema    No results found for this or any previous visit (from the past 24 hour(s)).    Assessment & Plan:  A:   Left mastitis P:   Meds ordered this encounter  Medications  . dicloxacillin (DYNAPEN) 250 MG capsule    Sig: Take 1 capsule (250 mg total) by mouth 4 (four) times daily.    Dispense:  28 capsule    Refill:  0    Order Specific Question:   Supervising Provider    Answer:   Lazaro ArmsEURE, LUTHER H [  [id number]]  . fluconazole (DIFLUCAN) 150 MG tablet    Sig: 1 po stat; repeat in 3 days    Dispense:  2 tablet    Refill:  2    Order Specific Question:   Supervising Provider    Answer:   Lazaro ArmsEURE, LUTHER H [[id number]]      Return if symptoms worsen or fail to improve, for As scheduled.  CRESENZO-DISHMAN,Rue Valladares CNM 10/18/2016 10:17 AM

## 2017-09-11 ENCOUNTER — Other Ambulatory Visit: Payer: Self-pay | Admitting: Obstetrics & Gynecology

## 2017-10-09 ENCOUNTER — Other Ambulatory Visit: Payer: Self-pay

## 2017-10-09 ENCOUNTER — Other Ambulatory Visit (HOSPITAL_COMMUNITY)
Admission: RE | Admit: 2017-10-09 | Discharge: 2017-10-09 | Disposition: A | Discharge status: Home / Self Care | Payer: 59 | Source: Ambulatory Visit | Attending: Adult Health | Admitting: Adult Health

## 2017-10-09 ENCOUNTER — Ambulatory Visit (INDEPENDENT_AMBULATORY_CARE_PROVIDER_SITE_OTHER): Payer: 59 | Admitting: Adult Health

## 2017-10-09 ENCOUNTER — Encounter: Payer: Self-pay | Admitting: Adult Health

## 2017-10-09 VITALS — BP 112/58 | HR 87 | Ht 63.0 in | Wt 120.0 lb

## 2017-10-09 DIAGNOSIS — Z3041 Encounter for surveillance of contraceptive pills: Secondary | ICD-10-CM

## 2017-10-09 DIAGNOSIS — Z01419 Encounter for gynecological examination (general) (routine) without abnormal findings: Secondary | ICD-10-CM | POA: Insufficient documentation

## 2017-10-09 MED ORDER — NORETHINDRONE ACET-ETHINYL EST 1-20 MG-MCG PO TABS: 1.0000 | ORAL_TABLET | Freq: Every day | ORAL | 11 refills | Status: DC

## 2017-10-09 NOTE — Progress Notes (Signed)
Patient ID: Morgan Hodges, female   DOB: [birth year], 31 y.o.   MRN: [id number] History of Present Illness: Morgan Hodges is a 31 year old white female, married, G2P2 in for well woman gyn exam and pap.She wants to get back on regular OCs has stopped breastfeeding.  [place] [facility]    Current Medications, Allergies, Past Medical History, Past Surgical History, Family History and Social History were reviewed in [facility] electronic medical record.     Review of Systems: Patient denies any headaches, hearing loss, fatigue, blurred vision, shortness of breath, chest pain, abdominal pain, problems with bowel movements, urination, or intercourse. No joint pain or mood swings.Has stopped breastfeeding, wants regular OCs.    Physical Exam:BP (!) 112/58 (BP Location: Right Arm, Patient Position: Sitting, Cuff Size: Normal)   Pulse 87   Ht 5\' 3"  (1.6 m)   Wt 120 lb (54.4 kg)   LMP 09/30/2017   Breastfeeding? No   BMI 21.26 kg/m   General:  Well developed, well nourished, no acute distress Skin:  Warm and dry Neck:  Midline trachea, normal thyroid, good ROM, no lymphadenopathy Lungs; Clear to auscultation bilaterally Breast:  No dominant palpable mass, retraction, or nipple discharge Cardiovascular: Regular rate and rhythm Abdomen:  Soft, non tender, no hepatosplenomegaly Pelvic:  External genitalia is normal in appearance, no lesions.  The vagina is normal in appearance. Urethra has no lesions or masses. The cervix is bulbous.Pap with HPV performed.  Uterus is felt to be normal size, shape, and contour.  No adnexal masses or tenderness noted.Bladder is non tender, no masses felt. Extremities/musculoskeletal:  No swelling or varicosities noted, no clubbing or cyanosis Psych:  No mood changes, alert and cooperative,seems happy PHQ 2 score 0.  Impression: 1. Encounter for gynecological examination with Papanicolaou smear of cervix   2. Encounter for surveillance of contraceptive pills        Plan: Meds ordered this encounter  Medications  . norethindrone-ethinyl estradiol (MICROGESTIN,JUNEL,LOESTRIN) 1-20 MG-MCG tablet    Sig: Take 1 tablet by mouth daily.    Dispense:  1 Package    Refill:  11    Order Specific Question:   Supervising Provider    Answer:   Lazaro ArmsEURE, LUTHER H [[id number]]  Use condoms for 1 pack Physical in 1 year Pap in 3 if normal

## 2017-10-11 LAB — CYTOLOGY - PAP
Diagnosis: NEGATIVE
HPV: NOT DETECTED

## 2018-02-27 ENCOUNTER — Telehealth: Payer: Self-pay | Admitting: Adult Health

## 2018-02-27 MED ORDER — NORETHIN-ETH ESTRAD-FE BIPHAS 1 MG-10 MCG / 10 MCG PO TABS: 1.0000 | ORAL_TABLET | Freq: Every day | ORAL | 11 refills | Status: DC

## 2018-02-27 NOTE — Telephone Encounter (Signed)
Patient called stating that she would like a call from [facility], Pt states that she would like to change her BC. Pt states that the one she is on she does not like. Please contact pt

## 2018-02-27 NOTE — Telephone Encounter (Signed)
Complains of period early, only spots when period due,will switch to lo loestrin, use condoms

## 2018-06-26 DIAGNOSIS — N3001 Acute cystitis with hematuria: Secondary | ICD-10-CM | POA: Diagnosis not present

## 2018-12-31 ENCOUNTER — Other Ambulatory Visit: Payer: Self-pay | Admitting: Adult Health

## 2019-02-23 ENCOUNTER — Other Ambulatory Visit: Payer: Self-pay | Admitting: Adult Health

## 2019-07-10 ENCOUNTER — Other Ambulatory Visit: Payer: Self-pay | Admitting: Adult Health

## 2019-07-17 ENCOUNTER — Other Ambulatory Visit: Payer: Self-pay | Admitting: *Deleted

## 2019-07-17 ENCOUNTER — Encounter: Payer: Self-pay | Admitting: *Deleted

## 2019-07-17 MED ORDER — LO LOESTRIN FE 1 MG-10 MCG / 10 MCG PO TABS: 1.0000 | ORAL_TABLET | Freq: Every day | ORAL | 12 refills | Status: DC

## 2019-07-17 NOTE — Telephone Encounter (Signed)
Calling regarding [facility] refill request.

## 2019-07-17 NOTE — Addendum Note (Signed)
Addended by: Christiana Pellant A on: 07/17/2019 11:54 AM   Modules accepted: Orders

## 2019-09-28 ENCOUNTER — Ambulatory Visit
Admission: EM | Admit: 2019-09-28 | Discharge: 2019-09-28 | Disposition: A | Discharge status: Home / Self Care | Payer: 59 | Attending: Emergency Medicine | Admitting: Emergency Medicine

## 2019-09-28 ENCOUNTER — Other Ambulatory Visit: Payer: Self-pay

## 2019-09-28 DIAGNOSIS — Z20828 Contact with and (suspected) exposure to other viral communicable diseases: Secondary | ICD-10-CM

## 2019-09-28 DIAGNOSIS — Z20822 Contact with and (suspected) exposure to covid-19: Secondary | ICD-10-CM

## 2019-09-28 NOTE — ED Provider Notes (Signed)
[facility] [facility]   [id number] 09/28/19 Arrival Time: 3818   CC: Runny nose and congestion; COVID exposure  SUBJECTIVE: History from: patient.  Morgan Hodges is a 33 y.o. female who presents with runny nose and congestion x 6 days .  Admits to COVID exposure 5 days ago to step dad.  Has tried OTC medications with relief.  Denies aggravating factors.  Reports previous symptoms in the past.   Denies fever, chills, fatigue, sinus pain, sore throat, cough, SOB, wheezing, chest pain, nausea, changes in bowel or bladder habits.    ROS: As per HPI.  All other pertinent ROS negative.     Past Medical History:  Diagnosis Date  . Contraceptive management 03/09/2015  . Lymph node disorder    cervical  . Preeclampsia in postpartum period   . Pregnant 01/06/2016  . Supervision of normal pregnancy in first trimester 01/27/2016   Past Surgical History:  Procedure Laterality Date  . NO PAST SURGERIES     Allergies  Allergen Reactions  . Sulfa Antibiotics     Childhood reaction, thinks it was a rash   No current facility-administered medications on file prior to encounter.    Current Outpatient Medications on File Prior to Encounter  Medication Sig Dispense Refill  . acetaminophen (TYLENOL) 325 MG tablet Take 2 tablets (650 mg total) by mouth every 6 (six) hours as needed for mild pain or moderate pain. 30 tablet 0  . ibuprofen (ADVIL,MOTRIN) 200 MG tablet Take 200 mg by mouth every 6 (six) hours as needed.    . Norethindrone-Ethinyl Estradiol-Fe Biphas (LO LOESTRIN FE) 1 MG-10 MCG / 10 MCG tablet Take 1 tablet by mouth daily. 28 tablet 12  . Prenatal Vit-Fe Fumarate-FA (PRENATAL MULTIVITAMIN) TABS tablet Take 1 tablet by mouth daily at 12 noon.     Social History   Socioeconomic History  . Marital status: Married    Spouse name: Not on file  . Number of children: Not on file  . Years of education: Not on file  . Highest education level: Not on file  Occupational History  . Not  on file  Social Needs  . Financial resource strain: Not on file  . Food insecurity    Worry: Not on file    Inability: Not on file  . Transportation needs    Medical: Not on file    Non-medical: Not on file  Tobacco Use  . Smoking status: Never Smoker  . Smokeless tobacco: Never Used  Substance and Sexual Activity  . Alcohol use: No  . Drug use: No  . Sexual activity: Yes    Partners: Male    Birth control/protection: Pill  Lifestyle  . Physical activity    Days per week: Not on file    Minutes per session: Not on file  . Stress: Not on file  Relationships  . Social Herbalist on phone: Not on file    Gets together: Not on file    Attends religious service: Not on file    Active member of club or organization: Not on file    Attends meetings of clubs or organizations: Not on file    Relationship status: Not on file  . Intimate partner violence    Fear of current or ex partner: Not on file    Emotionally abused: Not on file    Physically abused: Not on file    Forced sexual activity: Not on file  Other Topics Concern  .  Not on file  Social History Narrative  . Not on file   Family History  Problem Relation Age of Onset  . Cancer Mother        breast  . Cancer Maternal Grandmother        breast  . Stroke Paternal Grandfather   . Cancer Paternal Grandmother        stomach    OBJECTIVE:  Vitals:   09/28/19 1345  BP: 127/80  Pulse: 91  Resp: 17  Temp: 98.4 F (36.9 C)  TempSrc: Oral  SpO2: 98%     General appearance: alert; appears mildly fatigued, but nontoxic; speaking in full sentences and tolerating own secretions HEENT: NCAT; Ears: EACs clear, TMs pearly gray; Eyes: PERRL.  EOM grossly intact. Nose: nares patent without rhinorrhea, Throat: oropharynx clear, tonsils non erythematous or enlarged, uvula midline  Neck: supple without LAD Lungs: unlabored respirations, symmetrical air entry; cough: absent; no respiratory distress; CTAB Heart:  regular rate and rhythm.  Radial pulses 2+ symmetrical bilaterally Skin: warm and dry Psychological: alert and cooperative; normal mood and affect  ASSESSMENT & PLAN:  1. Suspected COVID-19 virus infection    COVID testing ordered.  It will take between 5-7 days for test results.  Someone will contact you regarding abnormal results.    In the meantime: You should remain isolated in your home for 10 days from symptom onset AND greater than 72 hours after symptoms resolution (absence of fever without the use of fever-reducing medication and improvement in respiratory symptoms), whichever is longer Get plenty of rest and push fluids Use OTC Zyrtec for nasal congestion, runny nose, and/or sore throat Use OTC Flonase for nasal congestion and runny nose Use medications daily for symptom relief Use OTC medications like ibuprofen or tylenol as needed fever or pain Call or go to the ED if you have any new or worsening symptoms such as fever, worsening cough, shortness of breath, chest tightness, chest pain, turning blue, changes in mental status, etc...   Reviewed expectations re: course of current medical issues. Questions answered. Outlined signs and symptoms indicating need for more acute intervention. Patient verbalized understanding. After Visit Summary given.         Rennis Harding, PA-C 09/28/19 1541

## 2019-09-28 NOTE — ED Triage Notes (Signed)
Pt presents with cold symptoms since last week, pts step dad is positive, exposure last wed

## 2019-09-28 NOTE — Discharge Instructions (Addendum)
COVID testing ordered.  It will take between 5-7 days for test results.  Someone will contact you regarding abnormal results.   ° °In the meantime: °You should remain isolated in your home for 10 days from symptom onset AND greater than 72 hours after symptoms resolution (absence of fever without the use of fever-reducing medication and improvement in respiratory symptoms), whichever is longer °Get plenty of rest and push fluids °Use OTC Zyrtec for nasal congestion, runny nose, and/or sore throat °Use OTC Flonase for nasal congestion and runny nose °Use medications daily for symptom relief °Use OTC medications like ibuprofen or tylenol as needed fever or pain °Call or go to the ED if you have any new or worsening symptoms such as fever, worsening cough, shortness of breath, chest tightness, chest pain, turning blue, changes in mental status, etc...  °

## 2019-09-30 LAB — NOVEL CORONAVIRUS, NAA: SARS-CoV-2, NAA: NOT DETECTED

## 2019-10-09 ENCOUNTER — Ambulatory Visit (INDEPENDENT_AMBULATORY_CARE_PROVIDER_SITE_OTHER): Payer: 59 | Admitting: Adult Health

## 2019-10-09 ENCOUNTER — Other Ambulatory Visit: Payer: Self-pay

## 2019-10-09 ENCOUNTER — Other Ambulatory Visit: Payer: Self-pay | Admitting: Adult Health

## 2019-10-09 ENCOUNTER — Encounter: Payer: Self-pay | Admitting: Adult Health

## 2019-10-09 VITALS — BP 118/74 | HR 87 | Ht 62.0 in | Wt 117.0 lb

## 2019-10-09 DIAGNOSIS — Z3041 Encounter for surveillance of contraceptive pills: Secondary | ICD-10-CM | POA: Diagnosis not present

## 2019-10-09 DIAGNOSIS — Z1321 Encounter for screening for nutritional disorder: Secondary | ICD-10-CM | POA: Diagnosis not present

## 2019-10-09 DIAGNOSIS — Z01419 Encounter for gynecological examination (general) (routine) without abnormal findings: Secondary | ICD-10-CM | POA: Diagnosis not present

## 2019-10-09 DIAGNOSIS — N943 Premenstrual tension syndrome: Secondary | ICD-10-CM | POA: Diagnosis not present

## 2019-10-09 MED ORDER — NORETHIN ACE-ETH ESTRAD-FE 1-20 MG-MCG PO TABS: 1.0000 | ORAL_TABLET | Freq: Every day | ORAL | 4 refills | Status: DC

## 2019-10-09 MED ORDER — TAYTULLA 1-20 MG-MCG(24) PO CAPS: 1.0000 | ORAL_CAPSULE | Freq: Every day | ORAL | 4 refills | Status: DC

## 2019-10-09 NOTE — Progress Notes (Signed)
[facility] says insurance will not cover taytulla, will rx junel 1/20

## 2019-10-09 NOTE — Progress Notes (Signed)
Patient ID: Morgan Hodges, female   DOB: 03-01-1986, 33 y.o.   MRN: [id number] History of Present Illness: Morgan Hodges is a 33 year old white female, married, G2P2 in for a well woman gyn ex,She had normal pap with negative HPV 10/09/17. 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 PCP is Dr Karie Kirks.    Current Medications, Allergies, Past Medical History, Past Surgical History, Family History and Social History were reviewed in [employer] record.     Review of Systems: Patient denies any headaches, hearing loss, fatigue, blurred vision, shortness of breath, chest pain, abdominal pain, problems with bowel movements, urination, or intercourse. No joint pain. Spots the week before period due, and has noticed moody that week, esp at home, not bad at work Not in the mood for sex.    Physical Exam:BP 118/74 (BP Location: Left Arm, Patient Position: Sitting, Cuff Size: Normal)   Pulse 87   Ht 5\' 2"  (1.575 m)   Wt 117 lb (53.1 kg)   LMP 09/27/2019 (Approximate)   BMI 21.40 kg/m  General:  Well developed, well nourished, no acute distress Skin:  Warm and dry Neck:  Midline trachea, normal thyroid, good ROM, no lymphadenopathy Lungs; Clear to auscultation bilaterally Breast:  No dominant palpable mass, retraction, or nipple discharge Cardiovascular: Regular rate and rhythm Abdomen:  Soft, non tender, no hepatosplenomegaly Pelvic:  External genitalia is normal in appearance, no lesions.  The vagina is normal in appearance. Urethra has no lesions or masses. The cervix is bulbous.  Uterus is felt to be normal size, shape, and contour.  No adnexal masses or tenderness noted.Bladder is non tender, no masses felt. Extremities/musculoskeletal:  No swelling or varicosities noted, no clubbing or cyanosis Psych:  No mood changes, alert and cooperative,seems happy Fall risk is low PHQ 2 score is 0 Examination chaperoned by Levy Pupa LPN  Impression and Plan:  1. Encounter for  well woman exam with routine gynecological exam Pap and physical in 1 year Check CBC,CMP,TSH and lipids, and vitamin D, fasting today    2. Encounter for surveillance of contraceptive pills Finish Lo Loestrin and start Taytulla Meds ordered this encounter  Medications  . Norethin Ace-Eth Estrad-FE (TAYTULLA) 1-20 MG-MCG(24) CAPS    Sig: Take 1 tablet by mouth daily.    Dispense:  84 capsule    Refill:  4    Order Specific Question:   Supervising Provider    Answer:   Tania Ade H [[id number]]  Use condoms for 1 pack   3. PMS (premenstrual syndrome)  Can text me on Mychart in about 3 months for follow up on if pill change has helped or not

## 2019-10-10 LAB — LIPID PANEL
Chol/HDL Ratio: 2.5 ratio (ref 0.0–4.4)
Cholesterol, Total: 158 mg/dL (ref 100–199)
HDL: 63 mg/dL (ref >39)
LDL Chol Calc (NIH): 84 mg/dL (ref 0–99)
Triglycerides: 51 mg/dL (ref 0–149)
VLDL Cholesterol Cal: 11 mg/dL (ref 5–40)

## 2019-10-10 LAB — COMPREHENSIVE METABOLIC PANEL
ALT: 10 IU/L (ref 0–32)
AST: 14 IU/L (ref 0–40)
Albumin/Globulin Ratio: 1.6 (ref 1.2–2.2)
Albumin: 4.3 g/dL (ref 3.8–4.8)
Alkaline Phosphatase: 42 IU/L (ref 39–117)
BUN/Creatinine Ratio: 25 — ABNORMAL HIGH (ref 9–23)
BUN: 22 mg/dL — ABNORMAL HIGH (ref 6–20)
Bilirubin Total: 0.2 mg/dL (ref 0.0–1.2)
CO2: 23 mmol/L (ref 20–29)
Calcium: 9.4 mg/dL (ref 8.7–10.2)
Chloride: 105 mmol/L (ref 96–106)
Creatinine, Ser: 0.89 mg/dL (ref 0.57–1.00)
GFR calc Af Amer: 98 mL/min/{1.73_m2} (ref >59)
GFR calc non Af Amer: 85 mL/min/{1.73_m2} (ref >59)
Globulin, Total: 2.7 g/dL (ref 1.5–4.5)
Glucose: 85 mg/dL (ref 65–99)
Potassium: 4.7 mmol/L (ref 3.5–5.2)
Sodium: 140 mmol/L (ref 134–144)
Total Protein: 7 g/dL (ref 6.0–8.5)

## 2019-10-10 LAB — CBC
Hematocrit: 42.4 % (ref 34.0–46.6)
Hemoglobin: 14.2 g/dL (ref 11.1–15.9)
MCH: 31 pg (ref 26.6–33.0)
MCHC: 33.5 g/dL (ref 31.5–35.7)
MCV: 93 fL (ref 79–97)
Platelets: 330 10*3/uL (ref 150–450)
RBC: 4.58 x10E6/uL (ref 3.77–5.28)
RDW: 12.2 % (ref 11.7–15.4)
WBC: 7.8 10*3/uL (ref 3.4–10.8)

## 2019-10-10 LAB — VITAMIN D 25 HYDROXY (VIT D DEFICIENCY, FRACTURES): Vit D, 25-Hydroxy: 28 ng/mL — ABNORMAL LOW (ref 30.0–100.0)

## 2019-10-10 LAB — TSH: TSH: 0.797 u[IU]/mL (ref 0.450–4.500)

## 2019-11-06 HISTORY — PX: BREAST BIOPSY: SHX20

## 2020-07-07 ENCOUNTER — Other Ambulatory Visit: Payer: Self-pay | Admitting: Internal Medicine

## 2020-07-07 DIAGNOSIS — Z1231 Encounter for screening mammogram for malignant neoplasm of breast: Secondary | ICD-10-CM

## 2020-08-11 ENCOUNTER — Other Ambulatory Visit: Payer: Self-pay

## 2020-08-11 ENCOUNTER — Ambulatory Visit
Admission: RE | Admit: 2020-08-11 | Discharge: 2020-08-11 | Disposition: A | Discharge status: Home / Self Care | Payer: 59 | Source: Ambulatory Visit | Attending: Internal Medicine | Admitting: Internal Medicine

## 2020-08-11 DIAGNOSIS — Z1231 Encounter for screening mammogram for malignant neoplasm of breast: Secondary | ICD-10-CM

## 2020-08-18 ENCOUNTER — Other Ambulatory Visit: Payer: Self-pay | Admitting: Internal Medicine

## 2020-08-18 DIAGNOSIS — R928 Other abnormal and inconclusive findings on diagnostic imaging of breast: Secondary | ICD-10-CM

## 2020-08-29 ENCOUNTER — Other Ambulatory Visit: Payer: Self-pay

## 2020-08-29 ENCOUNTER — Ambulatory Visit
Admission: RE | Admit: 2020-08-29 | Discharge: 2020-08-29 | Disposition: A | Discharge status: Home / Self Care | Payer: 59 | Source: Ambulatory Visit | Attending: Internal Medicine | Admitting: Internal Medicine

## 2020-08-29 ENCOUNTER — Other Ambulatory Visit: Payer: Self-pay | Admitting: Internal Medicine

## 2020-08-29 DIAGNOSIS — R928 Other abnormal and inconclusive findings on diagnostic imaging of breast: Secondary | ICD-10-CM

## 2020-08-29 DIAGNOSIS — N6310 Unspecified lump in the right breast, unspecified quadrant: Secondary | ICD-10-CM

## 2020-09-06 ENCOUNTER — Ambulatory Visit
Admission: RE | Admit: 2020-09-06 | Discharge: 2020-09-06 | Disposition: A | Discharge status: Home / Self Care | Payer: 59 | Source: Ambulatory Visit | Attending: Internal Medicine | Admitting: Internal Medicine

## 2020-09-06 ENCOUNTER — Other Ambulatory Visit: Payer: Self-pay

## 2020-09-06 DIAGNOSIS — N6310 Unspecified lump in the right breast, unspecified quadrant: Secondary | ICD-10-CM

## 2020-11-10 ENCOUNTER — Other Ambulatory Visit: Payer: 59 | Admitting: Adult Health

## 2020-12-22 ENCOUNTER — Other Ambulatory Visit: Payer: Self-pay

## 2020-12-22 ENCOUNTER — Encounter: Payer: Self-pay | Admitting: Cardiology

## 2020-12-22 ENCOUNTER — Ambulatory Visit: Payer: 59 | Admitting: Cardiology

## 2020-12-22 ENCOUNTER — Other Ambulatory Visit: Payer: Self-pay | Admitting: Cardiology

## 2020-12-22 ENCOUNTER — Ambulatory Visit (INDEPENDENT_AMBULATORY_CARE_PROVIDER_SITE_OTHER): Payer: 59

## 2020-12-22 VITALS — BP 108/64 | HR 86 | Ht 62.0 in | Wt 119.4 lb

## 2020-12-22 DIAGNOSIS — I493 Ventricular premature depolarization: Secondary | ICD-10-CM

## 2020-12-22 DIAGNOSIS — R002 Palpitations: Secondary | ICD-10-CM | POA: Diagnosis not present

## 2020-12-22 NOTE — Patient Instructions (Signed)
Medication Instructions:  Your physician recommends that you continue on your current medications as directed. Please refer to the Current Medication list given to you today.  *If you need a refill on your cardiac medications before your next appointment, please call your pharmacy*   Lab Work: None today  If you have labs (blood work) drawn today and your tests are completely normal, you will receive your results only by: Marland Kitchen MyChart Message (if you have MyChart) OR . A paper copy in the mail If you have any lab test that is abnormal or we need to change your treatment, we will call you to review the results.   Testing/Procedures: Your physician has requested that you have an echocardiogram. Echocardiography is a painless test that uses sound waves to create images of your heart. It provides your doctor with information about the size and shape of your heart and how well your heart's chambers and valves are working. This procedure takes approximately one hour. There are no restrictions for this procedure.  ZIO XT- Long Term Monitor Instructions   Your physician has requested you wear your ZIO patch monitor____3___days.   This is a single patch monitor.  [employer] supplies one patch monitor per enrollment.  Additional stickers are not available.   Please do not apply patch if you will be having a Nuclear Stress Test, Echocardiogram, Cardiac CT, MRI, or Chest Xray during the time frame you would be wearing the monitor. The patch cannot be worn during these tests.  You cannot remove and re-apply the ZIO XT patch monitor.    Do not shower for the first 24 hours.  You may shower after the first 24 hours.   Press button if you feel a symptom. You will hear a small click.  Record Date, Time and Symptom in the Patient Log Book.   When you are ready to remove patch, follow instructions on last 2 pages of Patient Log Book.  Stick patch monitor onto last page of Patient Log Book.   Place Patient  Log Book in [place] box.  Use locking tab on box and tape box closed securely.  The [employer] and [employer] has [employer] on it.  Please place in mailbox as soon as possible.  Your physician should have your test results approximately 7 days after the monitor has been mailed back to [facility].   Call [facility] Customer Care at [phone] if you have questions regarding your ZIO XT patch monitor.  Call them immediately if you see an orange light blinking on your monitor.   If your monitor falls off in less than 4 days contact our Monitor department at [phone].  If your monitor becomes loose or falls off after 4 days call [employer] at [phone] for suggestions on securing your monitor.        We will call you with test results.         Thank you for choosing [facility] !

## 2020-12-22 NOTE — Progress Notes (Signed)
Cardiology Office Note  Date: 12/22/2020   ID: RANDALL RAMPERSAD, DOB 19-Dec-1985, MRN [id number]  PCP:  Benita Stabile, MD  Cardiologist:  Nona Dell, MD Electrophysiologist:  None   Chief Complaint  Patient presents with  . Palpitations    History of Present Illness: Morgan Hodges is a 35 y.o. female referred for cardiology consultation by Dr. Margo Aye for evaluation of palpitations.  She is a respiratory therapist, works at [employer]  She states that she has had an intermittent history of palpitations over the years, noticed on prior contraceptive medication, also with previous pregnancy.  Never any documented cardiac arrhythmias.  This past December she started feeling more of a sense of palpitations, a skip or brief sinking feeling in her chest.  She was feeling more prolonged episodes than usual, no definite trigger.  She cut back caffeine on her own.  Did have the Pfizer Covid vaccine in December.  She has never had any associated syncope, no exertional chest pain.  I personally reviewed her ECG today which shows sinus rhythm with nonspecific ST changes.  Intervals are normal.  No delta waves or Brugada pattern.  She has no known history of thyroid disease, TSH was normal by lab work in December 2020.  Past Medical History:  Diagnosis Date  . Preeclampsia in postpartum period     Past Surgical History:  Procedure Laterality Date  . NO PAST SURGERIES      Current Outpatient Medications  Medication Sig Dispense Refill  . acetaminophen (TYLENOL) 325 MG tablet Take 2 tablets (650 mg total) by mouth every 6 (six) hours as needed for mild pain or moderate pain. 30 tablet 0  . ibuprofen (ADVIL,MOTRIN) 200 MG tablet Take 200 mg by mouth every 6 (six) hours as needed.    . Multiple Vitamin (MULTIVITAMIN) tablet Take 1 tablet by mouth.     No current facility-administered medications for this visit.   Allergies:  Sulfa antibiotics   Social History: The patient  reports  that she has never smoked. She has never used smokeless tobacco. She reports that she does not drink alcohol and does not use drugs.   Family History: The patient's family history includes Breast cancer in her maternal grandmother; Breast cancer (age of onset: 35) in her mother; Cancer in her paternal grandmother; Stroke in her paternal grandfather.   ROS: No specific exertional limitation.  Physical Exam: VS:  BP 108/64 (BP Location: Left Arm)   Pulse 86   Ht 5\' 2"  (1.575 m)   Wt 119 lb 6.4 oz (54.2 kg)   SpO2 99%   BMI 21.84 kg/m , BMI Body mass index is 21.84 kg/m.  Wt Readings from Last 3 Encounters:  12/22/20 119 lb 6.4 oz (54.2 kg)  10/09/19 117 lb (53.1 kg)  10/09/17 120 lb (54.4 kg)    General: Patient appears comfortable at rest. HEENT: Conjunctiva and lids normal, wearing a mask. Neck: Supple, no elevated JVP or carotid bruits. Lungs: Clear to auscultation, nonlabored breathing at rest. Cardiac: Regular rate and rhythm, no S3 or significant systolic murmur, no pericardial rub. Extremities: No pitting edema, distal pulses 2+. Skin: Warm and dry. Musculoskeletal: No kyphosis. Neuropsychiatric: Alert and oriented x3, affect grossly appropriate.  ECG:  An ECG dated 08/29/2016 was personally reviewed today and demonstrated:  Sinus rhythm with small R' in lead V1.  Recent Labwork:    Component Value Date/Time   CHOL 158 10/09/2019 1009   TRIG 51 10/09/2019 1009  HDL 63 10/09/2019 1009   CHOLHDL 2.5 10/09/2019 1009   LDLCALC 84 10/09/2019 1009  December 2020: TSH 0.797, BUN 22, creatinine 0.89, potassium 4.7, AST 14, ALT 10, hemoglobin 14.2, platelets 330  Other Studies Reviewed Today:  No previous cardiac testing for review.  Assessment and Plan:  Intermittent palpitations as outlined above.  Description sounds most consistent with PVCs.  Her ECG today shows normal sinus rhythm with normal intervals, no preexcitation or Brugada pattern.  Agree with cutting back  caffeine as she has already done.  She states that the symptoms have been getting better recently, still feels them briefly on a day-to-day basis.  We will obtain a 72-hour Zio patch for further investigation, also an echocardiogram to ensure normal cardiac structure and function.  Medication Adjustments/Labs and Tests Ordered: Current medicines are reviewed at length with the patient today.  Concerns regarding medicines are outlined above.   Tests Ordered: Orders Placed This Encounter  Procedures  . ECHOCARDIOGRAM COMPLETE    Medication Changes: No orders of the defined types were placed in this encounter.   Disposition:  Follow up test results.  Signed, Jonelle Sidle, MD, [facility] 12/22/2020 10:00 AM    [facility] at [facility] [address], [place], [place] [address] Phone: [phone]; Fax: [phone]

## 2020-12-22 NOTE — Addendum Note (Signed)
Addended by: Kerney Elbe on: 12/22/2020 10:59 AM   Modules accepted: Orders

## 2021-01-02 ENCOUNTER — Other Ambulatory Visit: Payer: Self-pay

## 2021-01-02 ENCOUNTER — Encounter: Payer: Self-pay | Admitting: Adult Health

## 2021-01-02 ENCOUNTER — Ambulatory Visit: Payer: 59 | Admitting: Adult Health

## 2021-01-02 ENCOUNTER — Other Ambulatory Visit (HOSPITAL_COMMUNITY)
Admission: RE | Admit: 2021-01-02 | Discharge: 2021-01-02 | Disposition: A | Discharge status: Home / Self Care | Payer: 59 | Source: Ambulatory Visit | Attending: Adult Health | Admitting: Adult Health

## 2021-01-02 VITALS — BP 105/66 | HR 71 | Ht 62.0 in | Wt 120.0 lb

## 2021-01-02 DIAGNOSIS — Z01419 Encounter for gynecological examination (general) (routine) without abnormal findings: Secondary | ICD-10-CM | POA: Diagnosis not present

## 2021-01-02 DIAGNOSIS — Z803 Family history of malignant neoplasm of breast: Secondary | ICD-10-CM | POA: Diagnosis not present

## 2021-01-02 NOTE — Progress Notes (Signed)
   NURSE VISIT- [facility]  SUBJECTIVE:  Morgan Hodges is a 35 y.o. G54P2002 female here for [facility] Hered[facility] Cancer Screening . She is a GYN patient.   OBJECTIVE:  Appears well, in no apparent distress  Blood work drawn from right [facility] without difficulty. 1 attempt(s).   ASSESSMENT: GYN patient [facility]red[facility] Cancer Screening  PLAN: [facility] portal information given and instructed patient how to access results   Jobe Marker  01/02/2021 11:23 AM

## 2021-01-02 NOTE — Progress Notes (Signed)
Patient ID: Morgan Hodges, female   DOB: [birth year], 35 y.o.   MRN: [id number] History of Present Illness: Morgan Hodges is a 35 year old white female, married, G2P2 in of a well woman gyn exam and pap. She is a Engineer, civil (consulting) at [employer]. PCP is Dr Margo Aye.   Current Medications, Allergies, Past Medical History, Past Surgical History, Family History and Social History were reviewed in Morgan Hodges record.     Review of Systems: Patient denies any headaches, hearing loss, fatigue, blurred vision, shortness of breath, chest pain, abdominal pain, problems with bowel movements, urination, or intercourse. No joint pain or mood swings.    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 Cardiovascular: Regular rate and rhythm Abdomen:  Soft, non tender, no hepatosplenomegaly Pelvic:  External genitalia is normal in appearance, no lesions.  The vagina is normal in appearance. Urethra has no lesions or masses. The cervix is bulbous,pap with HR HPV genotyping performed.  Uterus is felt to be normal size, shape, and contour.  No adnexal masses or tenderness noted.Bladder is non tender, no masses felt. Extremities/musculoskeletal:  No swelling or varicosities noted, no clubbing or cyanosis Psych:  No mood changes, alert and cooperative,seems happy AA is 0  Fall risk is low PHQ 9 score is 1 GAD 7 score is 1  Upstream - 01/02/21 1036      Pregnancy Intention Screening   Does the patient want to become pregnant in the next year? No    Does the  patient's partner want to become pregnant in the next year? No    Would the patient like to discuss contraceptive options today? No      Contraception Wrap Up   Current Method Female Condom    End Method Female Condom    Contraception Counseling Provided No         Examination chaperoned by 01/04/21 LPN  Impression and Plan: 1. Encounter for gynecological examination with Papanicolaou smear of cervix Pap sent Pap in 3 years if normal Physical in 1 year Has had labs with PCP Mammogram yearly at 32.  Colonoscopy at 44   2. Family history of breast cancer in mother Will get [facility] hereditary cancer drawn

## 2021-01-04 LAB — CYTOLOGY - PAP
Adequacy: ABSENT
Comment: NEGATIVE
Diagnosis: NEGATIVE
High risk HPV: NEGATIVE

## 2021-01-05 ENCOUNTER — Other Ambulatory Visit: Payer: Self-pay

## 2021-01-05 ENCOUNTER — Ambulatory Visit (HOSPITAL_COMMUNITY)
Admission: RE | Admit: 2021-01-05 | Discharge: 2021-01-05 | Disposition: A | Discharge status: Home / Self Care | Payer: 59 | Source: Ambulatory Visit | Attending: Cardiology | Admitting: Cardiology

## 2021-01-05 DIAGNOSIS — I493 Ventricular premature depolarization: Secondary | ICD-10-CM | POA: Diagnosis present

## 2021-01-05 LAB — ECHOCARDIOGRAM COMPLETE
AR max vel: 1.62 cm2
AV Area VTI: 1.54 cm2
AV Area mean vel: 1.47 cm2
AV Mean grad: 5.4 mmHg
AV Peak grad: 8.7 mmHg
Ao pk vel: 1.48 m/s
Area-P 1/2: 3.31 cm2
S' Lateral: 2.7 cm

## 2021-01-05 NOTE — Progress Notes (Signed)
*  PRELIMINARY RESULTS* Echocardiogram 2D Echocardiogram has been performed.  Stacey Drain 01/05/2021, 11:30 AM

## 2021-01-11 ENCOUNTER — Telehealth: Payer: Self-pay | Admitting: *Deleted

## 2021-01-11 NOTE — Telephone Encounter (Signed)
-----   Message from Jonelle Sidle, MD sent at 01/10/2021  6:06 PM EST ----- Results reviewed.  Please let [name] know that the cardiac monitor was very reassuring.  She had only rare premature atrial contractions and premature ventricular contractions, all less than 1% total beats.  There were no significant arrhythmias or pauses.  Heart rate variation was normal as well.  In light of this and reassuring echocardiogram, would not anticipate further cardiac testing unless symptoms escalate.

## 2021-01-11 NOTE — Telephone Encounter (Signed)
Pt.notified

## 2021-01-13 ENCOUNTER — Ambulatory Visit: Payer: 59 | Admitting: Cardiology

## 2021-12-15 ENCOUNTER — Other Ambulatory Visit: Payer: 59 | Admitting: Adult Health

## 2022-01-04 ENCOUNTER — Encounter: Payer: Self-pay | Admitting: Adult Health

## 2022-01-04 ENCOUNTER — Other Ambulatory Visit: Payer: Self-pay

## 2022-01-04 ENCOUNTER — Ambulatory Visit (INDEPENDENT_AMBULATORY_CARE_PROVIDER_SITE_OTHER): Payer: 59 | Admitting: Adult Health

## 2022-01-04 VITALS — BP 115/71 | HR 69 | Ht 63.0 in | Wt 122.0 lb

## 2022-01-04 DIAGNOSIS — R599 Enlarged lymph nodes, unspecified: Secondary | ICD-10-CM | POA: Diagnosis not present

## 2022-01-04 DIAGNOSIS — Z01419 Encounter for gynecological examination (general) (routine) without abnormal findings: Secondary | ICD-10-CM

## 2022-01-04 DIAGNOSIS — Z803 Family history of malignant neoplasm of breast: Secondary | ICD-10-CM

## 2022-01-04 NOTE — Progress Notes (Signed)
Patient ID: Morgan Hodges, female   DOB: 10-Jul-1986, 36 y.o.   MRN: [id number] ?History of Present Illness: ?Morgan Hodges is a 36 year old white female,married, G2P2 in for a well woman gyn exam ?She thinks lymph node right side of neck is getting  bigger, has seen ENT years ago. ? ?Lab Results  ?Component Value Date  ? DIAGPAP  01/02/2021  ?  - Negative for intraepithelial lesion or malignancy (NILM)  ? HPV NOT DETECTED 10/09/2017  ? HPVHIGH Negative 01/02/2021  ? PCP is Dr Margo Aye.  ? ?Current Medications, Allergies, Past Medical History, Past Surgical History, Family History and Social History were reviewed in [employer] record.   ? ? ?Review of Systems: ?Patient denies any headaches, hearing loss, fatigue, blurred vision, shortness of breath, chest pain, abdominal pain, problems with bowel movements, urination, or intercourse. No joint pain or mood swings. Periods are regular ?Lymph node in neck getting bigger ? ? ?Physical Exam:BP 115/71 (BP Location: Right Arm, Patient Position: Sitting, Cuff Size: Normal)   Pulse 69   Ht 5\' 3"  (1.6 m)   Wt 122 lb (55.3 kg)   LMP 12/19/2021 (Exact Date)   BMI 21.61 kg/m?   ?General:  Well developed, well nourished, no acute distress ?Skin:  Warm and dry ?Neck:  Midline trachea, normal thyroid, good ROM, has swollen mobile, non tender, lymph node right side of neck  ?Lungs; Clear to auscultation bilaterally ?Breast:  No dominant palpable mass, retraction, or nipple discharge ?Cardiovascular: Regular rate and rhythm ?Abdomen:  Soft, non tender, no hepatosplenomegaly ?Pelvic:  External genitalia is normal in appearance, no lesions.  The vagina is normal in appearance. Urethra has no lesions or masses. The cervix is bulbous.  Uterus is felt to be normal size, shape, and contour.  No adnexal masses or tenderness noted.Bladder is non tender, no masses felt. ?Rectal:Deferred  ?Extremities/musculoskeletal:  No swelling or varicosities noted, no clubbing or  cyanosis ?Psych:  No mood changes, alert and cooperative,seems happy ?AA is 1 ?Fall risk is low ?Depression screen [facility] 2/9 01/04/2022 01/02/2021 10/09/2019  ?Decreased Interest 0 0 0  ?Down, Depressed, Hopeless 0 0 0  ?PHQ - 2 Score 0 0 0  ?Altered sleeping 1 0 -  ?Tired, decreased energy 1 1 -  ?Change in appetite 0 0 -  ?Feeling bad or failure about yourself  0 0 -  ?Trouble concentrating 0 0 -  ?Moving slowly or fidgety/restless 0 0 -  ?Suicidal thoughts 0 0 -  ?PHQ-9 Score 2 1 -  ?  ?GAD 7 : Generalized Anxiety Score 01/04/2022 01/02/2021  ?Nervous, Anxious, on Edge 1 0  ?Control/stop worrying 0 0  ?Worry too much - different things 0 0  ?Trouble relaxing 1 1  ?Restless 0 0  ?Easily annoyed or irritable 0 0  ?Afraid - awful might happen 0 0  ?Total GAD 7 Score 2 1  ? ? Upstream - 01/04/22 0915   ? ?  ? Pregnancy Intention Screening  ? Does the patient want to become pregnant in the next year? No   ? Does the patient's partner want to become pregnant in the next year? No   ? Would the patient like to discuss contraceptive options today? No   ?  ? Contraception Wrap Up  ? Current Method Vasectomy   ? End Method Vasectomy   ? Contraception Counseling Provided No   ? ?  ?  ? ?  ? Examination chaperoned by 03/06/22 CMA ? ?  ?  Impression and Plan: ?1. Encounter for well woman exam with routine gynecological exam ?Physical in 1 year ?Pap in 2025 ?Labs with PCP  ? ?2. Swollen lymph nodes ?Will get neck US at [facility] 01/12/22 at 12:30 pm to assess lymph node, will talk when results back  ? ?3. Family history of breast cancer in mother ?Mammogram at 17 ? ? ? ?  ?  ?

## 2022-01-12 ENCOUNTER — Other Ambulatory Visit: Payer: Self-pay

## 2022-01-12 ENCOUNTER — Ambulatory Visit (HOSPITAL_COMMUNITY)
Admission: RE | Admit: 2022-01-12 | Discharge: 2022-01-12 | Disposition: A | Discharge status: Home / Self Care | Payer: 59 | Source: Ambulatory Visit | Attending: Adult Health | Admitting: Adult Health

## 2022-01-12 DIAGNOSIS — R599 Enlarged lymph nodes, unspecified: Secondary | ICD-10-CM

## 2022-01-14 IMAGING — MG MM DIGITAL DIAGNOSTIC UNILAT*R* W/ TOMO W/ CAD
4 series · 4 of 12 positions shown · non-contrast
Comparison: Previous exam(s).

CLINICAL DATA: Patient returns after baseline study for evaluation
of possible RIGHT breast mass. Patient's mother was diagnosed with
breast cancer at 48. Her maternal grandmother was diagnosed with
breast cancer in her 30s. No one in the family has had genetic
testing.

EXAM:
DIGITAL DIAGNOSTIC RIGHT MAMMOGRAM WITH CAD AND TOMO
ULTRASOUND RIGHT BREAST

[R CC synth-2D]
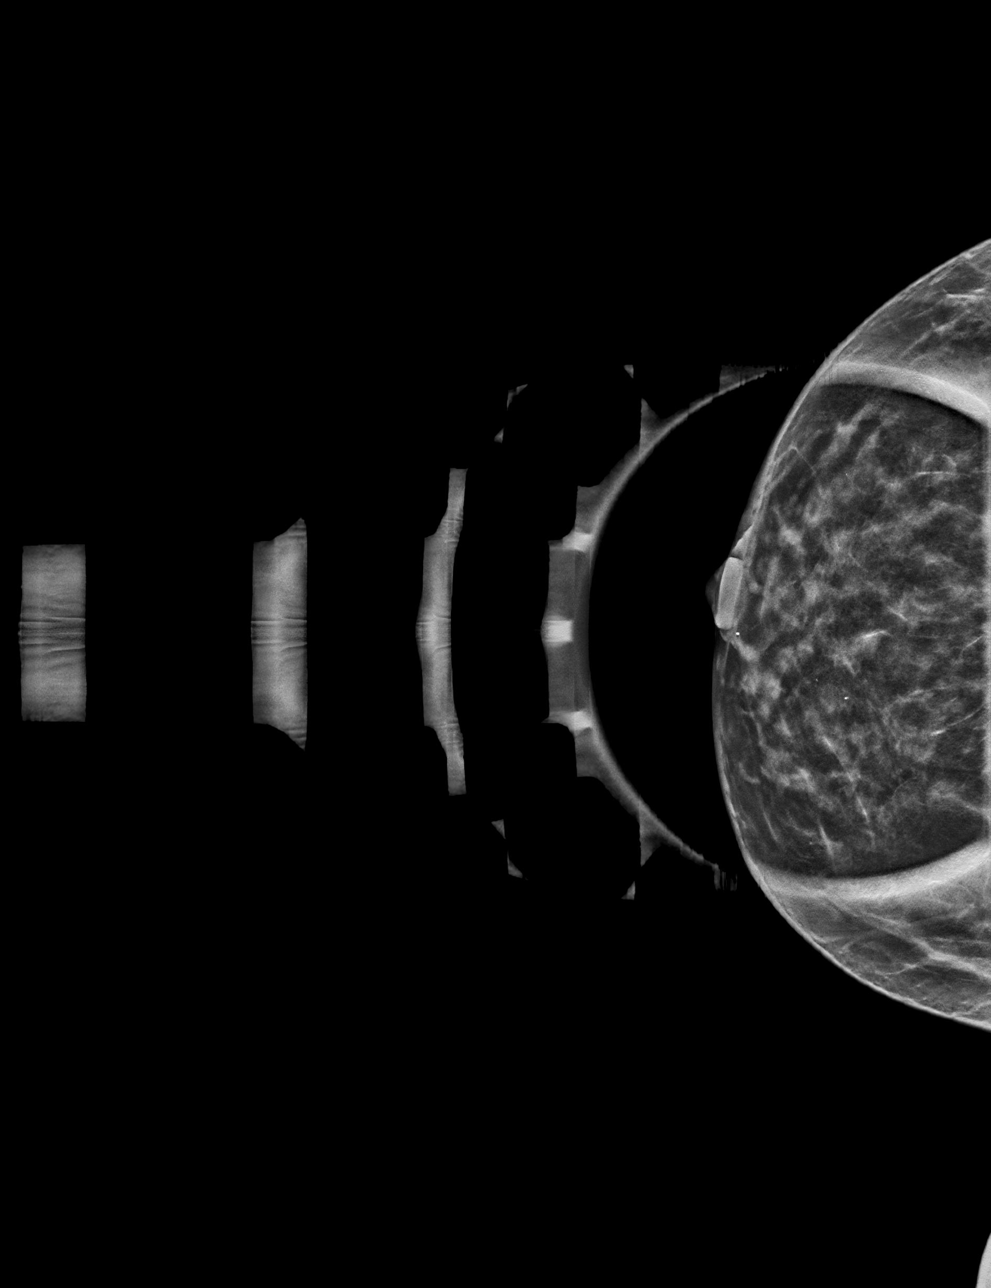

[R MLO synth-2D]
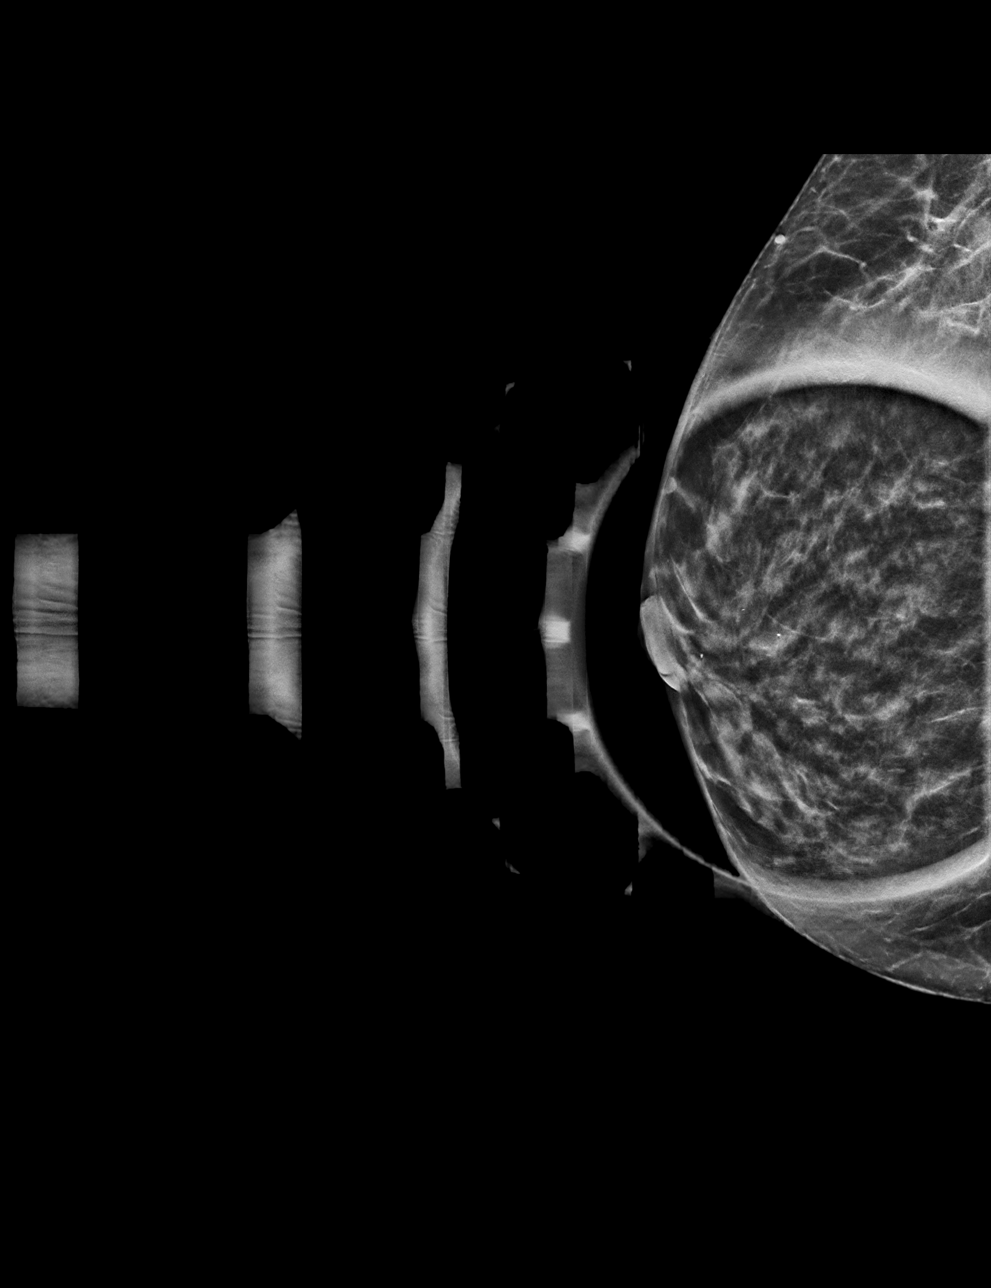

[R MLO tomo · tomo slice 25/48.0]
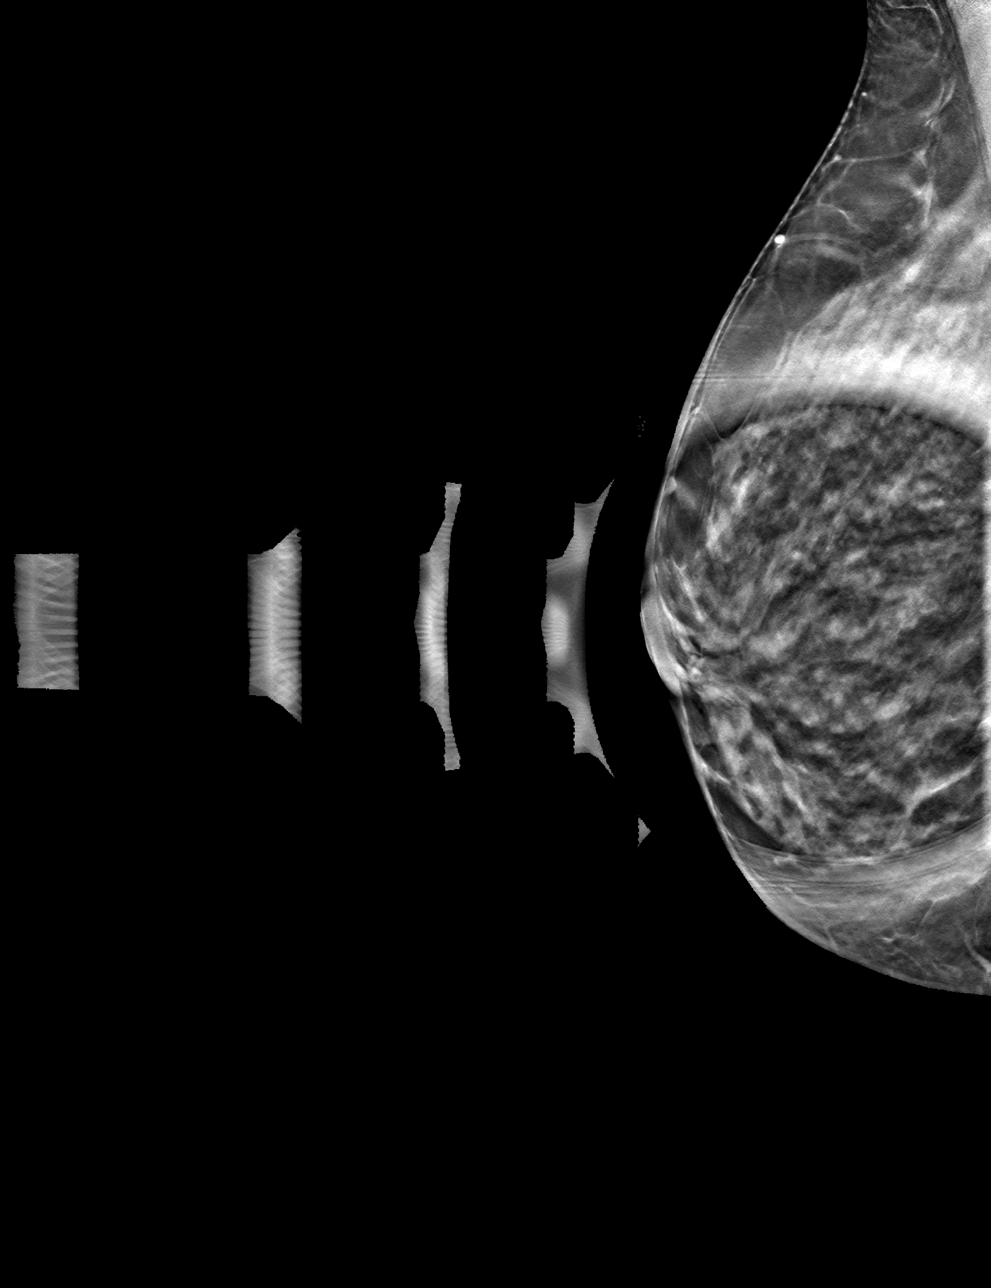

[R CC tomo · tomo slice 23/45.0]
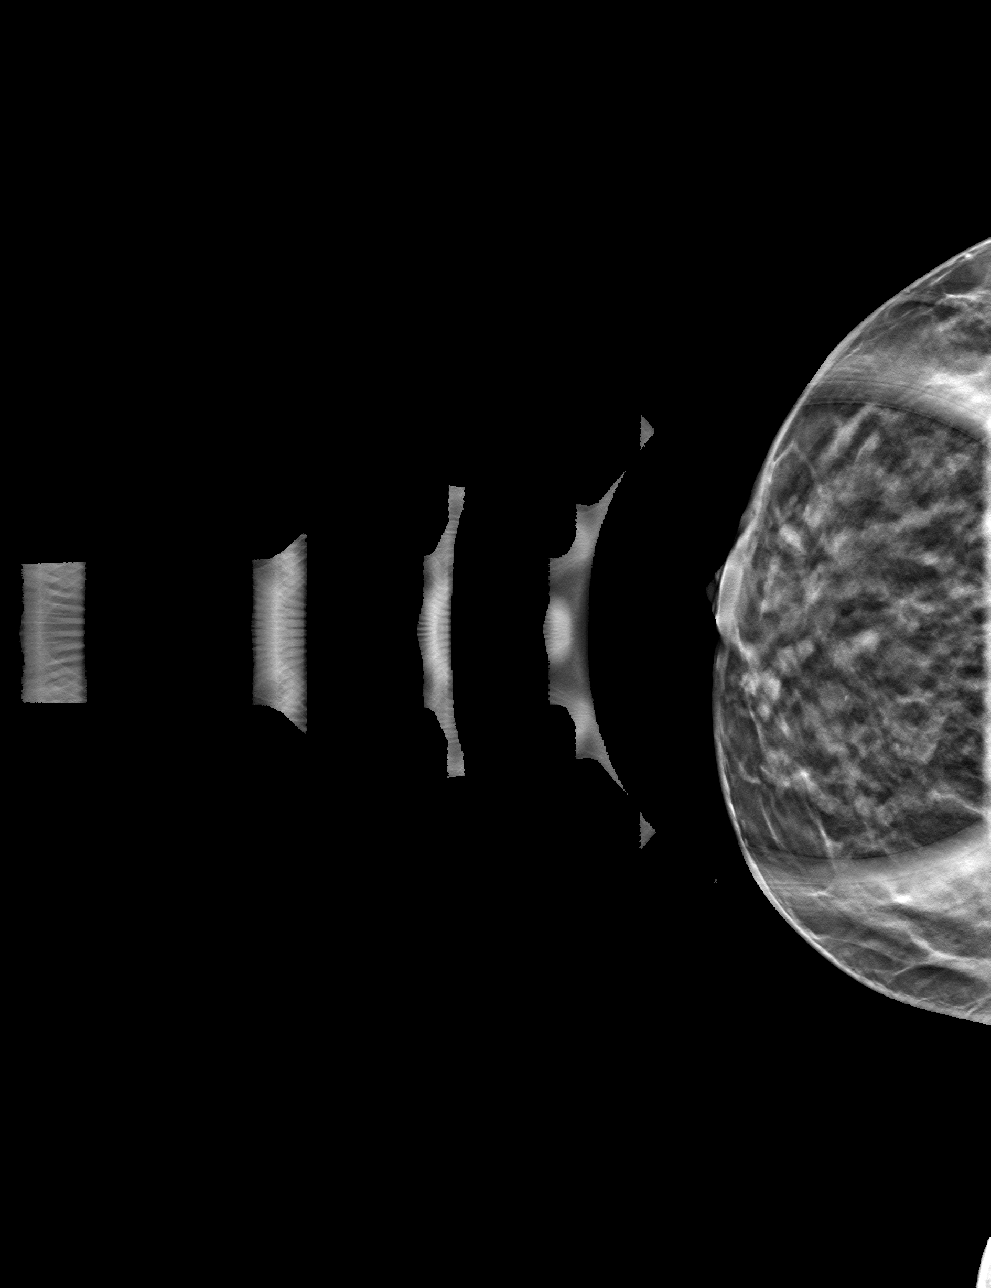

[4 of 12 positions shown; findings below may reference images not displayed]

ACR Breast Density Category c: The breast tissue is heterogeneously
dense, which may obscure small masses.
FINDINGS: Additional 2-D and 3-D images are performed. These views show dense
fibroglandular tissue in the retroareolar LOWER OUTER QUADRANT of
the RIGHT breast.

Mammographic images were processed with CAD.

On physical exam, I palpate no abnormality in the central portion of
the RIGHT breast.

Targeted ultrasound is performed, showing adjacent oval parallel
hypoechoic masses in the 3 o'clock retroareolar region of the RIGHT
breast. The larger is 1.4 x 0.3 x 0.4 centimeters. The smaller is
adjacent and measures 0.7 x 0.3 x 0.8 centimeters. Internal blood
flow is documented on Doppler evaluation.

Evaluation of the RIGHT axilla is negative for adenopathy.
IMPRESSION: Adjacent solid circumscribed masses in the 3 o'clock retroareolar
region of the RIGHT breast with benign features. Given the family
history of breast cancer we discussed ultrasound-guided core biopsy.
Patient agrees and biopsy will be scheduled.

RECOMMENDATION:
Recommend ultrasound-guided core biopsy of RIGHT breast mass. If
biopsy is benign, recommend annual screening to begin at age 37
given the patient's strong family history.

I have discussed the findings and recommendations with the patient.
If applicable, a reminder letter will be sent to the patient
regarding the next appointment.

BI-RADS CATEGORY  4: Suspicious.

## 2022-01-15 ENCOUNTER — Telehealth: Payer: Self-pay

## 2022-01-15 ENCOUNTER — Telehealth: Payer: Self-pay | Admitting: Adult Health

## 2022-01-15 NOTE — Telephone Encounter (Signed)
Pt saw [place] and my message will proceed with CT neck, with IV contrast, will get scheduled for her  ?

## 2022-01-15 NOTE — Telephone Encounter (Signed)
PT STATED THAT SHE IS RETURNING A CALL FROM JENNIFER ?

## 2022-01-15 NOTE — Telephone Encounter (Signed)
Left message to call me about neck US  ?

## 2022-01-16 ENCOUNTER — Telehealth: Payer: Self-pay | Admitting: Adult Health

## 2022-01-16 ENCOUNTER — Other Ambulatory Visit: Payer: Self-pay | Admitting: Adult Health

## 2022-01-16 DIAGNOSIS — R221 Localized swelling, mass and lump, neck: Secondary | ICD-10-CM

## 2022-01-16 NOTE — Progress Notes (Signed)
CT neck at [facility] with contrast 02/02/22 at 12:30 pm liquids 4 hours prior, be there 15 minutes early. Daisy to get precert. ?

## 2022-01-16 NOTE — Telephone Encounter (Signed)
Left message :CT neck at [facility] with contrast 02/02/22 at 12:30 pm liquids 4 hours prior, be there 15 minutes early. Daisy to get precert. ? ?

## 2022-02-02 ENCOUNTER — Ambulatory Visit (HOSPITAL_COMMUNITY)
Admission: RE | Admit: 2022-02-02 | Discharge: 2022-02-02 | Disposition: A | Discharge status: Home / Self Care | Payer: 59 | Source: Ambulatory Visit | Attending: Adult Health | Admitting: Adult Health

## 2022-02-02 DIAGNOSIS — R221 Localized swelling, mass and lump, neck: Secondary | ICD-10-CM | POA: Insufficient documentation

## 2022-02-02 MED ORDER — IOHEXOL 300 MG/ML  SOLN
75.0000 mL | Freq: Once | INTRAMUSCULAR | Status: AC | PRN
  Administered 2022-02-02: 75 mL via INTRAVENOUS

## 2023-02-13 ENCOUNTER — Ambulatory Visit: Payer: 59 | Admitting: Adult Health

## 2023-03-11 ENCOUNTER — Ambulatory Visit (INDEPENDENT_AMBULATORY_CARE_PROVIDER_SITE_OTHER): Payer: 59 | Admitting: Adult Health

## 2023-03-11 ENCOUNTER — Encounter: Payer: Self-pay | Admitting: Adult Health

## 2023-03-11 VITALS — BP 110/71 | HR 63 | Ht 63.0 in | Wt 122.0 lb

## 2023-03-11 DIAGNOSIS — Z01419 Encounter for gynecological examination (general) (routine) without abnormal findings: Secondary | ICD-10-CM | POA: Diagnosis not present

## 2023-03-11 DIAGNOSIS — Z803 Family history of malignant neoplasm of breast: Secondary | ICD-10-CM | POA: Diagnosis not present

## 2023-03-11 DIAGNOSIS — Z1231 Encounter for screening mammogram for malignant neoplasm of breast: Secondary | ICD-10-CM | POA: Diagnosis not present

## 2023-03-11 NOTE — Progress Notes (Signed)
Patient ID: Morgan Hodges, female   DOB: [birth year], 37 y.o.   MRN: [id number] History of Present Illness:  Morgan Hodges is a 37 year old white female,married, G2P2002, in for a well woman gyn exam.  Last pap was negative HPV,NILM 01/02/21.  PCP is Dr Margo Aye.   Current Medications, Allergies, Past Medical History, Past Surgical History, Family History and Social History were reviewed in [employer] record.     Review of Systems: Patient denies any headaches, hearing loss, fatigue, blurred vision, shortness of breath, chest pain, abdominal pain, problems with bowel movements, urination, or intercourse. No joint pain or mood swings.     Physical Exam:BP 110/71 (BP Location: Left Arm, Patient Position: Sitting, Cuff Size: Normal)   Pulse 63   Ht 5\' 3"  (1.6 m)   Wt 122 lb (55.3 kg)   LMP 02/11/2023   BMI 21.61 kg/m   General:  Well developed, well nourished, no acute distress Skin:  Warm and dry Neck:  Midline trachea, normal thyroid, good ROM, has enlarged lymph node on right had CT 2023 Lungs; Clear to auscultation bilaterally Breast:  No dominant palpable mass, retraction, or nipple discharge Cardiovascular: Regular rate and rhythm Abdomen:  Soft, non tender, no hepatosplenomegaly Pelvic:  External genitalia is normal in appearance, no lesions.  The vagina is normal in appearance. Urethra has no lesions or masses. The cervix is smooth.  Uterus is felt to be normal size, shape, and contour.  No adnexal masses or tenderness noted.Bladder is non tender, no masses felt. Extremities/musculoskeletal:  No swelling or varicosities noted, no clubbing or cyanosis Psych:  No mood changes, alert and cooperative,seems happy AA is 0 Fall risk is low    03/11/2023   10:55 AM 01/04/2022    9:14 AM 01/02/2021   10:32 AM  Depression screen PHQ 2/9  Decreased Interest 1 0 0  Down, Depressed, Hopeless 0 0 0  PHQ - 2 Score 1 0 0  Altered sleeping 1 1 0  Tired, decreased energy 1 1 1    Change in appetite 0 0 0  Feeling bad or failure about yourself  0 0 0  Trouble concentrating 0 0 0  Moving slowly or fidgety/restless 0 0 0  Suicidal thoughts 0 0 0  PHQ-9 Score 3 2 1        03/11/2023   10:55 AM 01/04/2022    9:15 AM 01/02/2021   10:32 AM  GAD 7 : Generalized Anxiety Score  Nervous, Anxious, on Edge 1 1 0  Control/stop worrying 0 0 0  Worry too much - different things 0 0 0  Trouble relaxing 0 1 1  Restless 0 0 0  Easily annoyed or irritable 1 0 0  Afraid - awful might happen 1 0 0  Total GAD 7 Score 3 2 1       Upstream - 03/11/23 1058       Pregnancy Intention Screening   Does the patient want to become pregnant in the next year? No    Does the patient's partner want to become pregnant in the next year? No    Would the patient like to discuss contraceptive options today? No      Contraception Wrap Up   Current Method Vasectomy    End Method Vasectomy    Contraception Counseling Provided No            Examination chaperoned by Malachy Mood LPN   Impression and Plan: 1. Encounter for well woman exam with routine  gynecological exam Pap and physical in 1 year Labs with PCP  2. Family history of breast cancer in mother Mom had breast cancer at 34 Will start mammogram at 89  3. Screening mammogram for breast cancer She [facility] 37[facility]  - MM 3D SCREENING MAMMOGRAM BILATERAL BREAST; Future

## 2023-05-30 IMAGING — US US SOFT TISSUE HEAD/NECK
1 series · 13 of 15 positions shown · non-contrast
Comparison: None.

CLINICAL DATA: 35-year-old female with a right lateral neck
palpable finding reportedly present for about 3 years but recently
enlarging. Status post influenza in [REDACTED].

EXAM:
ULTRASOUND OF HEAD/NECK SOFT TISSUES
TECHNIQUE: Ultrasound examination of the head and neck soft tissues was
performed in the area of clinical concern.

[Series 1: us soft tissue head & neck (non-thyroid) · 13 of 15 slices shown]
[im 1/15]
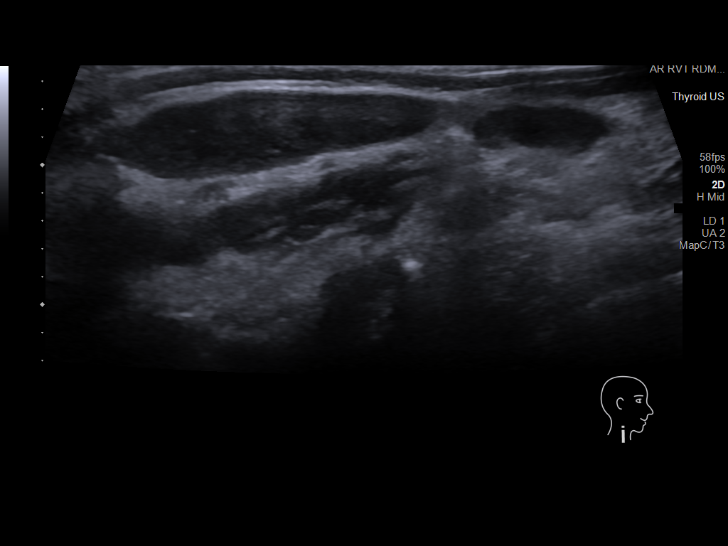
[im 2/15]
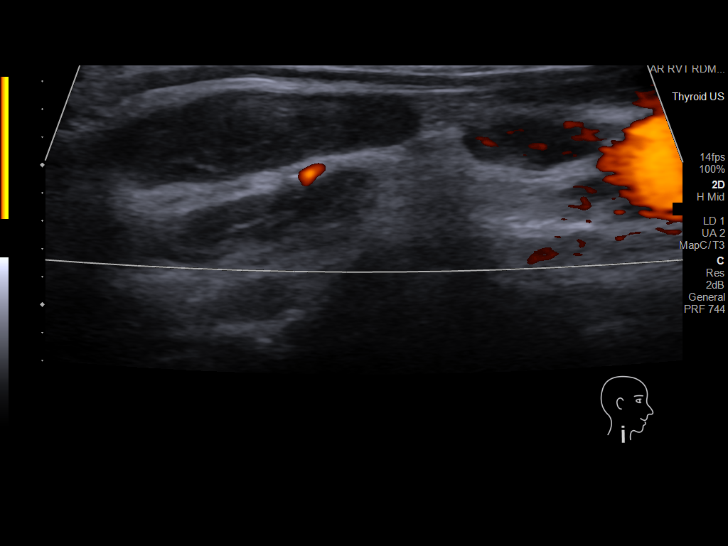
[im 3/15]
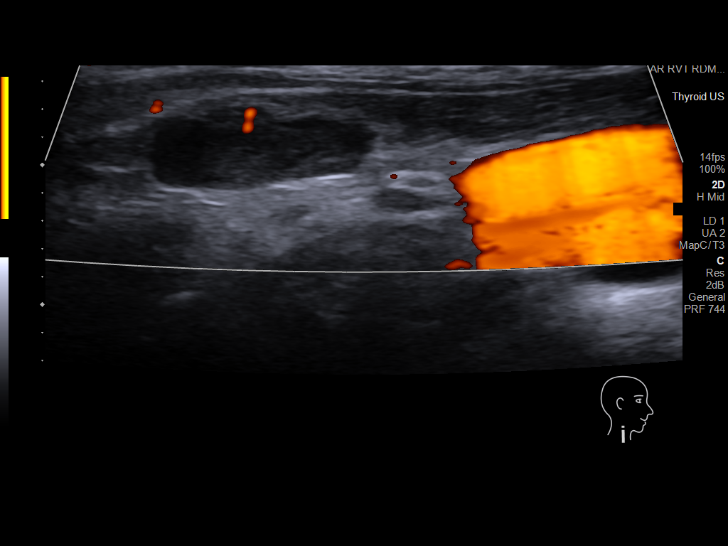
[im 5/15]
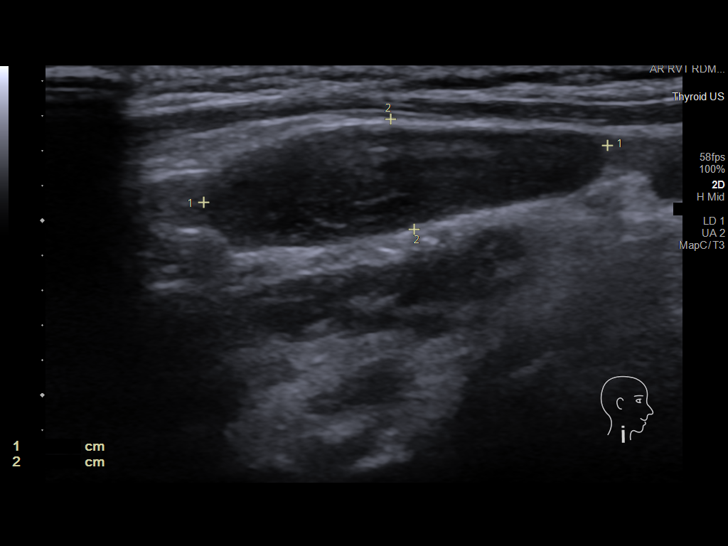
[im 6/15]
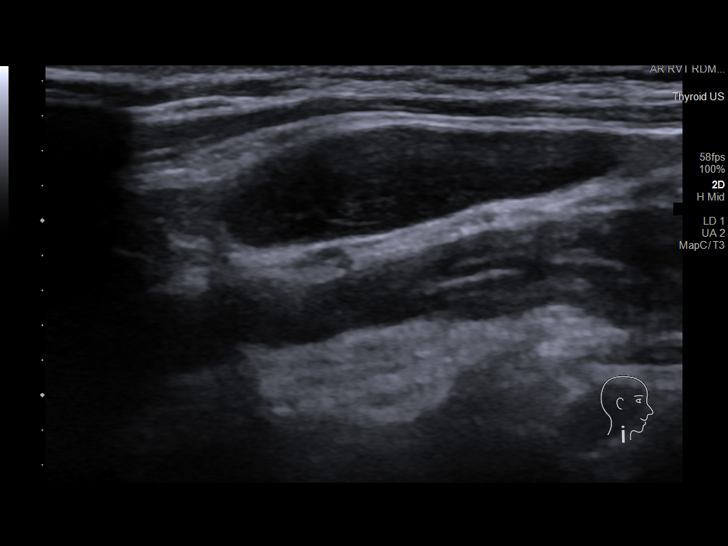
[im 7/15]
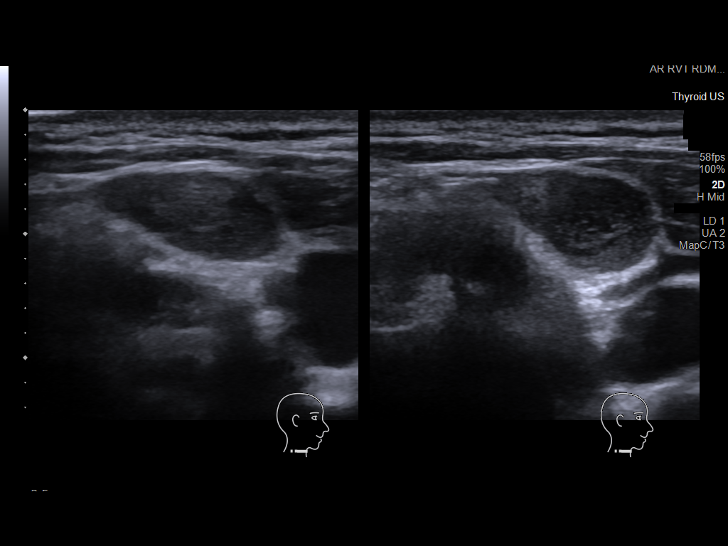
[im 8/15]
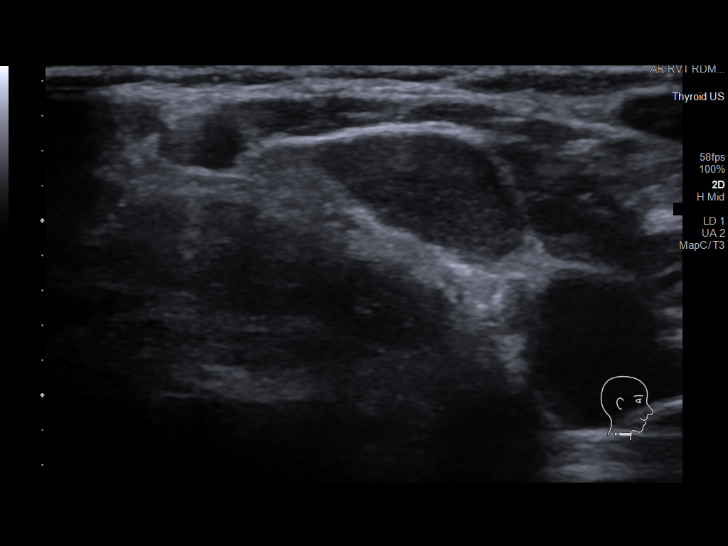
[im 9/15]
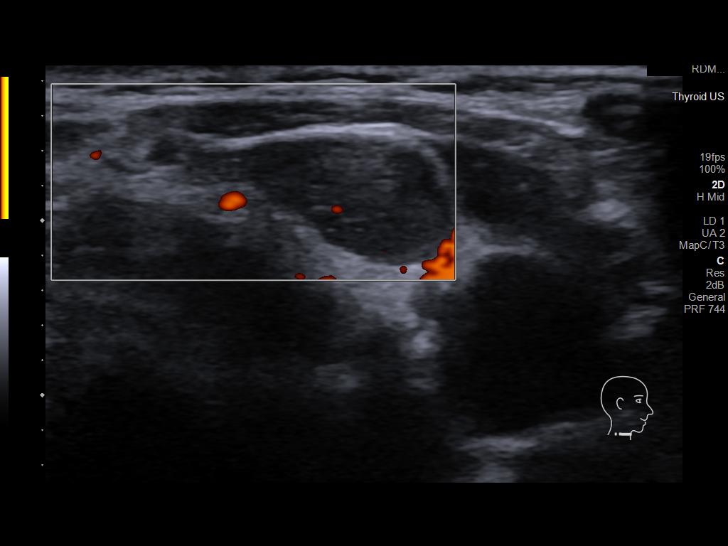
[im 10/15]
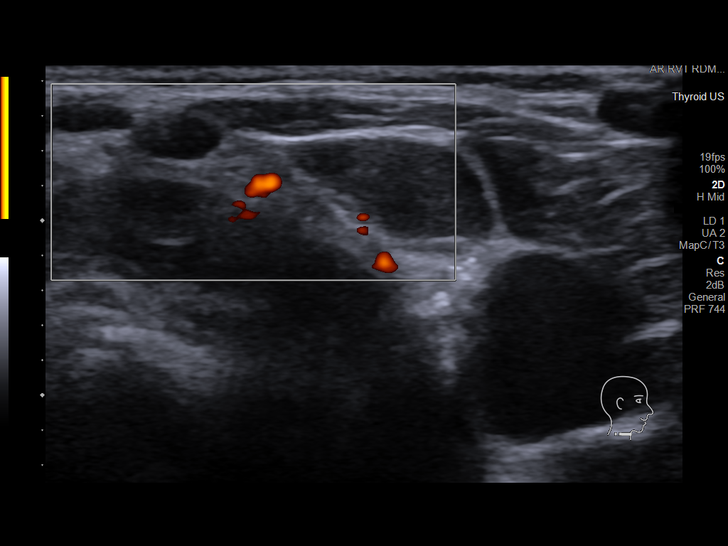
[im 11/15]
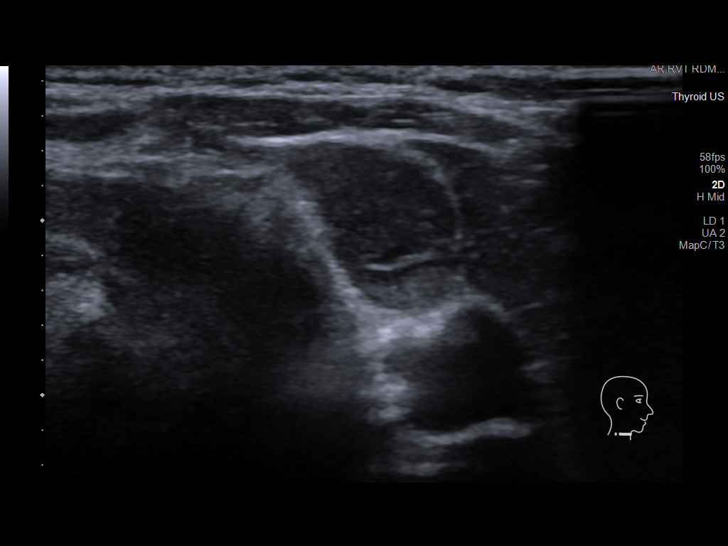
[im 13/15]
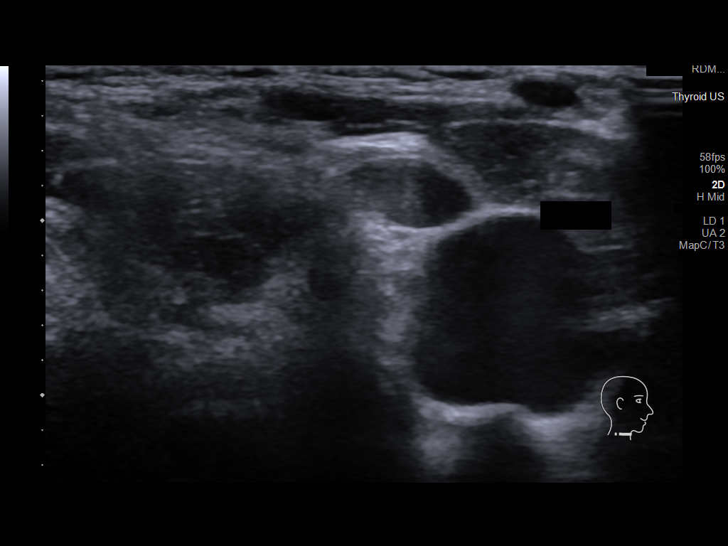
[im 14/15]
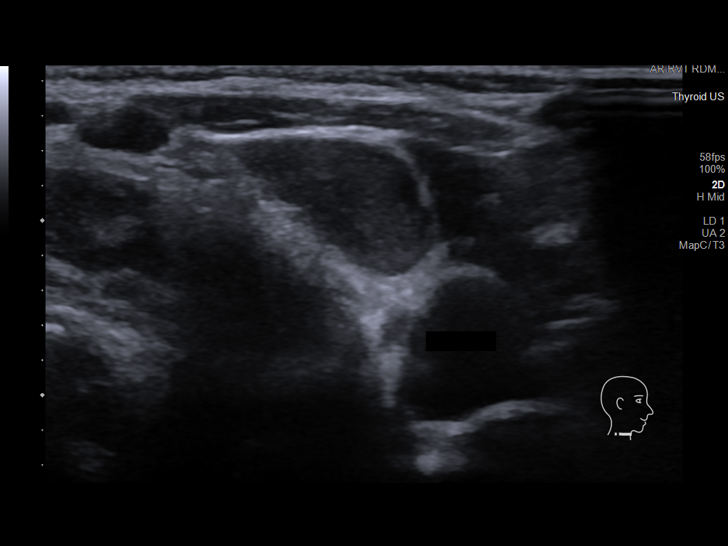
[im 15/15]
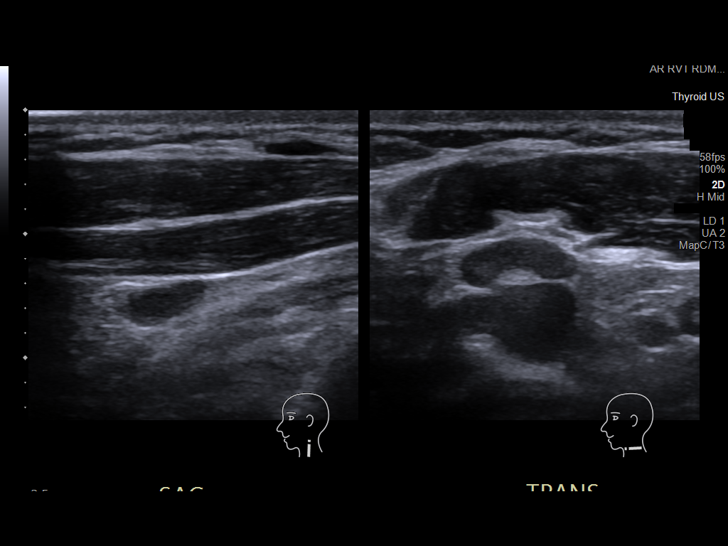

[13 of 15 positions shown; findings below may reference images not displayed]

FINDINGS: Right neck grayscale and brief power Doppler images demonstrate
multiple cervical lymph nodes. The largest node is elongated up to
2.3 cm long axis, 7 mm short axis (image 6). There is no physiologic
fatty hilum. But this is adjacent to the carotid space on that side.
Other visible fibromuscular architecture appears normal. This node
does appear substantially asymmetric from the left side at the same
level, included on image 16.
IMPRESSION: Indeterminate oval soft tissue mass in the right neck adjacent to
the carotid space, 2.3 x 0.7 x 0.7 cm. Although short axis size is
within normal limits for lymph node, there is no physiologic fatty
hilum to confirm this is nodal tissue. Reported time course favors a
benign etiology. Parathyroid adenoma and Paraganglioma can also
occur in this region. Recommend follow-up Neck CT (IV contrast
preferred) to further characterize.

## 2023-08-07 ENCOUNTER — Ambulatory Visit
Admission: RE | Admit: 2023-08-07 | Discharge: 2023-08-07 | Disposition: A | Discharge status: Home / Self Care | Payer: 59 | Source: Ambulatory Visit | Attending: Adult Health | Admitting: Adult Health

## 2023-08-07 DIAGNOSIS — Z1231 Encounter for screening mammogram for malignant neoplasm of breast: Secondary | ICD-10-CM

## 2023-08-09 ENCOUNTER — Other Ambulatory Visit: Payer: Self-pay | Admitting: Adult Health

## 2023-08-09 DIAGNOSIS — R928 Other abnormal and inconclusive findings on diagnostic imaging of breast: Secondary | ICD-10-CM

## 2023-08-12 ENCOUNTER — Ambulatory Visit
Admission: RE | Admit: 2023-08-12 | Discharge: 2023-08-12 | Disposition: A | Discharge status: Home / Self Care | Payer: 59 | Source: Ambulatory Visit | Attending: Adult Health | Admitting: Adult Health

## 2023-08-12 ENCOUNTER — Other Ambulatory Visit: Payer: 59

## 2023-08-12 DIAGNOSIS — R928 Other abnormal and inconclusive findings on diagnostic imaging of breast: Secondary | ICD-10-CM

## 2024-04-09 ENCOUNTER — Ambulatory Visit: Admitting: Adult Health

## 2024-04-09 ENCOUNTER — Other Ambulatory Visit (HOSPITAL_COMMUNITY)
Admission: RE | Admit: 2024-04-09 | Discharge: 2024-04-09 | Disposition: A | Discharge status: Home / Self Care | Source: Ambulatory Visit | Attending: Adult Health | Admitting: Adult Health

## 2024-04-09 ENCOUNTER — Encounter: Payer: Self-pay | Admitting: Adult Health

## 2024-04-09 VITALS — BP 131/74 | HR 76 | Ht 63.0 in | Wt 121.0 lb

## 2024-04-09 DIAGNOSIS — Z01419 Encounter for gynecological examination (general) (routine) without abnormal findings: Secondary | ICD-10-CM | POA: Diagnosis present

## 2024-04-09 DIAGNOSIS — Z803 Family history of malignant neoplasm of breast: Secondary | ICD-10-CM

## 2024-04-09 DIAGNOSIS — N943 Premenstrual tension syndrome: Secondary | ICD-10-CM

## 2024-04-09 DIAGNOSIS — Z1330 Encounter for screening examination for mental health and behavioral disorders, unspecified: Secondary | ICD-10-CM

## 2024-04-09 DIAGNOSIS — N6315 Unspecified lump in the right breast, overlapping quadrants: Secondary | ICD-10-CM

## 2024-04-09 NOTE — Progress Notes (Signed)
 Patient ID: Morgan Hodges, female   DOB: 1985/12/01, 38 y.o.   MRN: [id number] History of Present Illness: Morgan Hodges is a 38 year old white female, married, G2P2002, in for well woman gyn exam and pap. She has noticed right breast mass.   PCP is Dr Del Favia   Current Medications, Allergies, Past Medical History, Past Surgical History, Family History and Social History were reviewed in [employer] electronic medical record.     Review of Systems: Patient denies any hearing loss, fatigue, blurred vision, shortness of breath, chest pain, abdominal pain, problems with bowel movements, urination, or intercourse. No joint pain or mood swings.  Has headaches at times, starts in neck Has right breast mass  More moody before period, wonders if needs hormones checked     Physical Exam:BP 131/74 (BP Location: Left Arm, Patient Position: Sitting, Cuff Size: Normal)   Pulse 76   Ht 5\' 3"  (1.6 m)   Wt 121 lb (54.9 kg)   LMP 03/18/2024 (Exact Date)   BMI 21.43 kg/m   General:  Well developed, well nourished, no acute distress Skin:  Warm and dry Neck:  Midline trachea, normal thyroid, good ROM, no lymphadenopathy Lungs; Clear to auscultation bilaterally Breast:  No dominant palpable mass, retraction, or nipple discharge on the left, on the right has pea sized mass at 3 0'clock at areola edge and 2 more small nodules at 9-10 0'clock, no retraction or nipple discharge  Cardiovascular: Regular rate and rhythm Abdomen:  Soft, non tender, no hepatosplenomegaly Pelvic:  External genitalia is normal in appearance, no lesions.  The vagina is normal in appearance. Urethra has no lesions or masses. The cervix is bulbous. Pap with HR HPV genotyping performed, cervix bleed with EC brush. Uterus is felt to be normal size, shape, and contour.  No adnexal masses or tenderness noted.Bladder is non tender, no masses felt. Extremities/musculoskeletal:  No swelling or varicosities noted, no clubbing or cyanosis Psych:   No mood changes, alert and cooperative,seems happy AA is 1 Fall risk is low    04/09/2024    1:36 PM 03/11/2023   10:55 AM 01/04/2022    9:14 AM  Depression screen PHQ 2/9  Decreased Interest 1 1 0  Down, Depressed, Hopeless 1 0 0  PHQ - 2 Score 2 1 0  Altered sleeping 1 1 1   Tired, decreased energy 1 1 1   Change in appetite 0 0 0  Feeling bad or failure about yourself  0 0 0  Trouble concentrating 0 0 0  Moving slowly or fidgety/restless 0 0 0  Suicidal thoughts 0 0 0  PHQ-9 Score 4 3 2        04/09/2024    1:40 PM 03/11/2023   10:55 AM 01/04/2022    9:15 AM 01/02/2021   10:32 AM  GAD 7 : Generalized Anxiety Score  Nervous, Anxious, on Edge 1 1 1  0  Control/stop worrying 1 0 0 0  Worry too much - different things 0 0 0 0  Trouble relaxing 1 0 1 1  Restless 0 0 0 0  Easily annoyed or irritable 1 1 0 0  Afraid - awful might happen 1 1 0 0  Total GAD 7 Score 5 3 2 1       Upstream - 04/09/24 1332       Pregnancy Intention Screening   Does the patient want to become pregnant in the next year? No    Does the patient's partner want to become pregnant in the  next year? No    Would the patient like to discuss contraceptive options today? No      Contraception Wrap Up   Current Method Vasectomy    End Method Vasectomy    Contraception Counseling Provided No             Examination chaperoned by Alphonso Aschoff LPN  Impression and plan: 1. Encounter for gynecological examination with Papanicolaou smear of cervix (Primary) Pap sent Pap in 3 years if normal Physical in 1 year Labs with PCP - Cytology - PAP( [place])  2. Mass overlapping multiple quadrants of right breast Diagnotic right mammogram and US  [facility]06[facility]  - US  LIMITED ULTRASOUND INCLUDING AXILLA RIGHT BREAST; Future - MM Digital Diagnostic Unilat R; Future  3. Family history of breast cancer in mother  4. PMS (premenstrual syndrome) Try to increase exercise Take time for  self

## 2024-04-15 LAB — CYTOLOGY - PAP
Comment: NEGATIVE
Diagnosis: UNDETERMINED — AB
High risk HPV: NEGATIVE

## 2024-04-16 ENCOUNTER — Ambulatory Visit: Payer: Self-pay | Admitting: Adult Health

## 2024-04-16 DIAGNOSIS — R87610 Atypical squamous cells of undetermined significance on cytologic smear of cervix (ASC-US): Secondary | ICD-10-CM | POA: Insufficient documentation

## 2024-05-06 ENCOUNTER — Ambulatory Visit
Admission: RE | Admit: 2024-05-06 | Discharge: 2024-05-06 | Disposition: A | Discharge status: Home / Self Care | Source: Ambulatory Visit | Attending: Adult Health | Admitting: Adult Health

## 2024-05-06 DIAGNOSIS — N6315 Unspecified lump in the right breast, overlapping quadrants: Secondary | ICD-10-CM

## 2024-09-17 ENCOUNTER — Other Ambulatory Visit: Payer: Self-pay | Admitting: Adult Health

## 2024-09-17 DIAGNOSIS — Z1231 Encounter for screening mammogram for malignant neoplasm of breast: Secondary | ICD-10-CM

## 2024-09-24 ENCOUNTER — Ambulatory Visit
Admission: RE | Admit: 2024-09-24 | Discharge: 2024-09-24 | Disposition: A | Discharge status: Home / Self Care | Source: Ambulatory Visit | Attending: Adult Health | Admitting: Adult Health

## 2024-09-24 DIAGNOSIS — Z1231 Encounter for screening mammogram for malignant neoplasm of breast: Secondary | ICD-10-CM

## 2024-09-29 ENCOUNTER — Ambulatory Visit: Payer: Self-pay | Admitting: Adult Health

## 2024-11-04 ENCOUNTER — Encounter: Payer: Self-pay | Admitting: Cardiology

## 2024-11-04 ENCOUNTER — Ambulatory Visit: Attending: Cardiology | Admitting: Cardiology

## 2024-11-04 VITALS — BP 102/74 | HR 76 | Ht 63.0 in | Wt 124.4 lb

## 2024-11-04 DIAGNOSIS — R002 Palpitations: Secondary | ICD-10-CM | POA: Diagnosis not present

## 2024-11-04 DIAGNOSIS — I493 Ventricular premature depolarization: Secondary | ICD-10-CM | POA: Diagnosis not present

## 2024-11-04 NOTE — Progress Notes (Signed)
 "   Cardiology Office Note  Date: 11/04/2024   ID: Takyia, Sindt August 30, 1986, MRN [id number]  PCP: Shona Norleen PEDLAR, MD  Chief Complaint: No chief complaint on file.  History of Present Illness: Morgan Hodges is a 38 y.o. female referred by Dr. Shona to reestablish follow-up of palpitations.  She was seen in our office back in 2022, I reviewed the chart.  She continues to work as a buyer, retail at [employer].  Has a fairly longstanding history of palpitations described as skips and with previous cardiac monitor in 2022 showing rare PACs and PVCs.  She has had a few spells of more prolonged heart skipping in November (2 episodes) and December.  No associated dizziness or syncope, no obvious provocation.  She does not drink an unusual amount of coffee, no other energy drinks.  Not exercising as regularly over the last few months.  She did capture an Apple watch ECG during some of her episodes, looks to be consistent with PVCs most likely.  No atrial fibrillation.  I reviewed her medications.  I also reviewed her lab work from October.  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  I reviewed her ECG today which shows normal sinus rhythm.  Review of Systems: As outlined in the history of present illness.  Past Medical History: Past Medical History:  Diagnosis Date   Preeclampsia in postpartum period    Past Surgical History: Past Surgical History:  Procedure Laterality Date   BREAST BIOPSY Right 2021   NO PAST SURGERIES     Family History: Family History  Problem Relation Age of Onset   Breast cancer Mother 95   Breast cancer Maternal Grandmother    Stroke Paternal Grandfather    Cancer Paternal Grandmother        Stomach   Social History:  Social History   Tobacco Use   Smoking status: Never   Smokeless tobacco: Never   Substance Use Topics   Alcohol use: No   Medications: Medications Ordered Prior to Encounter[1] Allergies: Allergies[2] Physical Exam: VS:  BP 102/74   Pulse 76   Ht 5' 3 (1.6 m)   Wt 124 lb 6.4 oz (56.4 kg)   SpO2 99%   BMI 22.04 kg/m , BMI Body mass index is 22.04 kg/m.  Wt Readings from Last 3 Encounters:  11/04/24 124 lb 6.4 oz (56.4 kg)  04/09/24 121 lb (54.9 kg)  03/11/23 122 lb (55.3 kg)    General: Patient appears comfortable at rest. HEENT: Conjunctiva and lids normal. Neck: Supple, no elevated JVP or carotid bruits. Lungs: Clear to auscultation, nonlabored breathing at rest. Cardiac: Regular rate and rhythm, no S3 or significant systolic murmur, no pericardial rub. Musculoskeletal: No kyphosis. Neuropsychiatric: Alert and oriented x3, affect grossly appropriate.  ECG:  An ECG dated 12/22/2020 was personally reviewed today and demonstrated:  Sinus rhythm.  Labwork:  October 2025: Hemoglobin 14, platelets 275, BUN 20, creatinine 0.84, potassium 4.5, AST 12, ALT 9, GFR 91, cholesterol 149, triglycerides 55, HDL 65, LDL 72, hemoglobin A1c 5.5%  Other Studies Reviewed Today:  Cardiac monitor March 2022: ZIO XT reviewed. 2 days 23 hours analyzed. Predominant rhythm is sinus with heart rate ranging from 52 bpm up to 147 bpm and average heart rate 77 bpm. Rare PACs were noted including couplets and triplets, all representing less than 1% total beats.  There were also rare PVCs representing less than 1% total beats. Two patient triggered events did not correspond to ectopy. No significant arrhythmias or pauses.   Assessment and Plan:  Palpitations most consistent with PVCs.  Cardiac monitor from 2022 showed rare PACs and PVCs representing less than 1% total beats.  She has had a few more prolonged spells recently, but not necessarily in a progressive pattern and without any associated dizziness or sudden syncope.  Renal function and potassium normal by lab work in October  and her ECG is normal today as well.  I have recommended getting back to a regular exercise plan and monitoring symptoms for now.  If symptoms escalate we will plan on a repeat cardiac monitor.  Disposition:  Follow up 1 year, sooner if needed.  Signed, Jayson JUDITHANN Sierras, M.D., F.A.C.C. [facility]     [1]  Current Outpatient Medications on File Prior to Visit  Medication Sig Dispense Refill   acetaminophen  (TYLENOL ) 325 MG tablet Take 2 tablets (650 mg total) by mouth every 6 (six) hours as needed for mild pain or moderate pain. 30 tablet 0   EVENING PRIMROSE OIL PO Take by mouth.     ibuprofen  (ADVIL ,MOTRIN ) 200 MG tablet Take 200 mg by mouth every 6 (six) hours as needed.     MAGNESIUM  PO Take by mouth.     Multiple Vitamin (MULTIVITAMIN) tablet Take 1 tablet by mouth.     Probiotic Product (PROBIOTIC PO) Take by mouth.     No current facility-administered medications on file prior to visit.  [2]  Allergies Allergen Reactions   Sulfa Antibiotics     Childhood reaction, thinks it was a rash   "

## 2024-11-04 NOTE — Patient Instructions (Signed)
